# Patient Record
Sex: Female | Born: 1975 | Hispanic: Yes | Smoking: Never smoker
Health system: Southern US, Community
[De-identification: ages and names within clinical notes are randomized; demographics above are authoritative.]

## PROBLEM LIST (undated history)

## (undated) DIAGNOSIS — Z8679 Personal history of other diseases of the circulatory system: Secondary | ICD-10-CM

## (undated) DIAGNOSIS — E781 Pure hyperglyceridemia: Secondary | ICD-10-CM

## (undated) DIAGNOSIS — E119 Type 2 diabetes mellitus without complications: Secondary | ICD-10-CM

## (undated) DIAGNOSIS — Z5189 Encounter for other specified aftercare: Secondary | ICD-10-CM

## (undated) DIAGNOSIS — K859 Acute pancreatitis without necrosis or infection, unspecified: Secondary | ICD-10-CM

## (undated) DIAGNOSIS — R87619 Unspecified abnormal cytological findings in specimens from cervix uteri: Secondary | ICD-10-CM

## (undated) DIAGNOSIS — F419 Anxiety disorder, unspecified: Secondary | ICD-10-CM

## (undated) HISTORY — DX: Anxiety disorder, unspecified: F41.9

## (undated) HISTORY — PX: INCONTINENCE SURGERY: SHX676

## (undated) HISTORY — DX: Encounter for other specified aftercare: Z51.89

## (undated) HISTORY — PX: LEEP: SHX91

## (undated) HISTORY — DX: Type 2 diabetes mellitus without complications: E11.9

## (undated) HISTORY — PX: HERNIA REPAIR: SHX51

## (undated) HISTORY — PX: UPPER GASTROINTESTINAL ENDOSCOPY: SHX188

## (undated) HISTORY — DX: Pure hyperglyceridemia: E78.1

## (undated) HISTORY — PX: CHOLECYSTECTOMY: SHX55

---

## 2014-10-12 HISTORY — PX: OTHER SURGICAL HISTORY: SHX169

## 2016-08-22 ENCOUNTER — Emergency Department (HOSPITAL_BASED_OUTPATIENT_CLINIC_OR_DEPARTMENT_OTHER): Payer: Medicaid Other

## 2016-08-22 ENCOUNTER — Emergency Department (HOSPITAL_BASED_OUTPATIENT_CLINIC_OR_DEPARTMENT_OTHER)
Admission: EM | Admit: 2016-08-22 | Discharge: 2016-08-22 | Disposition: A | Discharge status: Home / Self Care | Payer: Medicaid Other | Attending: Emergency Medicine | Admitting: Emergency Medicine

## 2016-08-22 ENCOUNTER — Encounter (HOSPITAL_BASED_OUTPATIENT_CLINIC_OR_DEPARTMENT_OTHER): Payer: Self-pay | Admitting: *Deleted

## 2016-08-22 DIAGNOSIS — R51 Headache: Secondary | ICD-10-CM | POA: Diagnosis present

## 2016-08-22 DIAGNOSIS — M62830 Muscle spasm of back: Secondary | ICD-10-CM | POA: Insufficient documentation

## 2016-08-22 DIAGNOSIS — M62838 Other muscle spasm: Secondary | ICD-10-CM

## 2016-08-22 LAB — PREGNANCY, URINE: PREG TEST UR: NEGATIVE

## 2016-08-22 MED ORDER — METHOCARBAMOL 500 MG PO TABS
1000.0000 mg | ORAL_TABLET | Freq: Once | ORAL | Status: AC
  Administered 2016-08-22: 1000 mg via ORAL
  Filled 2016-08-22: qty 2

## 2016-08-22 MED ORDER — METHOCARBAMOL 500 MG PO TABS: 500.0000 mg | ORAL_TABLET | Freq: Two times a day (BID) | ORAL | 0 refills | Status: DC

## 2016-08-22 MED ORDER — NAPROXEN 375 MG PO TABS: 375.0000 mg | ORAL_TABLET | Freq: Two times a day (BID) | ORAL | 0 refills | Status: DC

## 2016-08-22 MED ORDER — ACETAMINOPHEN 500 MG PO TABS
1000.0000 mg | ORAL_TABLET | Freq: Once | ORAL | Status: AC
  Administered 2016-08-22: 1000 mg via ORAL
  Filled 2016-08-22: qty 2

## 2016-08-22 MED ORDER — PREDNISONE 50 MG PO TABS
60.0000 mg | ORAL_TABLET | Freq: Once | ORAL | Status: AC
  Administered 2016-08-22: 60 mg via ORAL
  Filled 2016-08-22: qty 1

## 2016-08-22 MED ORDER — IBUPROFEN 800 MG PO TABS
800.0000 mg | ORAL_TABLET | Freq: Once | ORAL | Status: AC
  Administered 2016-08-22: 800 mg via ORAL
  Filled 2016-08-22: qty 1

## 2016-08-22 MED ORDER — METOCLOPRAMIDE HCL 10 MG PO TABS
10.0000 mg | ORAL_TABLET | Freq: Once | ORAL | Status: AC
  Administered 2016-08-22: 10 mg via ORAL
  Filled 2016-08-22: qty 1

## 2016-08-22 MED ORDER — DIPHENHYDRAMINE HCL 25 MG PO CAPS
25.0000 mg | ORAL_CAPSULE | Freq: Once | ORAL | Status: AC
  Administered 2016-08-22: 25 mg via ORAL
  Filled 2016-08-22: qty 1

## 2016-08-22 NOTE — ED Triage Notes (Signed)
Pt c/o h/a x 2 hrs

## 2016-08-22 NOTE — ED Provider Notes (Signed)
[facility]   CSN: [id number] Arrival date & time: 08/22/16  0008     History   Chief Complaint Chief Complaint  Patient presents with  . Headache    HPI Allean FoundLaura Madrid is a 40 y.o. female.  The history is provided by the patient.  Headache   This is a new problem. The current episode started 1 to 2 hours ago. The problem occurs constantly. The problem has not changed since onset.The headache is associated with nothing. The pain is located in the occipital region. The quality of the pain is described as dull. The pain is moderate. The pain does not radiate. Pertinent negatives include no anorexia, no fever, no malaise/fatigue, no chest pressure, no near-syncope, no orthopnea, no palpitations, no syncope, no shortness of breath, no nausea and no vomiting. She has tried nothing for the symptoms. The treatment provided no relief.    History reviewed. No pertinent past medical history.  There are no active problems to display for this patient.   Past Surgical History:  Procedure Laterality Date  . CHOLECYSTECTOMY    . HERNIA REPAIR      OB History    No data available       Home Medications    Prior to Admission medications   Not on File    Family History No family history on file.  Social History Social History  Substance Use Topics  . Smoking status: Never Smoker  . Smokeless tobacco: Not on file  . Alcohol use No     Allergies   Penicillins   Review of Systems Review of Systems  Constitutional: Negative for appetite change, fever and malaise/fatigue.  HENT: Negative for drooling and ear pain.   Eyes: Negative for photophobia and visual disturbance.  Respiratory: Negative for shortness of breath.   Cardiovascular: Negative for palpitations, orthopnea, syncope and near-syncope.  Gastrointestinal: Negative for anorexia, nausea and vomiting.  Musculoskeletal: Negative for back pain and neck stiffness.  Neurological: Positive  for headaches. Negative for dizziness, seizures, syncope, facial asymmetry, speech difficulty, weakness, light-headedness and numbness.  All other systems reviewed and are negative.    Physical Exam Updated Vital Signs BP 132/99   Pulse 70   Temp 98 F (36.7 C)   Resp 18   Ht 5\' 5"  (1.651 m)   Wt 142 lb (64.4 kg)   SpO2 100%   BMI 23.63 kg/m   Physical Exam  Constitutional: She is oriented to person, place, and time. She appears well-developed and well-nourished. No distress.  Sitting in the room comfortably with all the lights on  HENT:  Head: Normocephalic and atraumatic.  Nose: Nose normal.  Mouth/Throat: No oropharyngeal exudate.  Spasm as the insertion of the trapezius  Eyes: Conjunctivae and EOM are normal. Pupils are equal, round, and reactive to light.  Neck: Normal range of motion. Neck supple. No JVD present. No neck rigidity. No tracheal deviation and normal range of motion present. No Brudzinski's sign and no Kernig's sign noted.  Cardiovascular: Normal rate, regular rhythm and intact distal pulses.   Pulmonary/Chest: Effort normal and breath sounds normal. No stridor. She has no wheezes. She has no rales.  Abdominal: Soft. Bowel sounds are normal. She exhibits no mass. There is no tenderness. There is no rebound and no guarding.  Musculoskeletal: Normal range of motion.  Lymphadenopathy:    She has no cervical adenopathy.  Neurological: She is alert and oriented to person, place, and time. She displays normal reflexes. No  cranial nerve deficit or sensory deficit. She exhibits normal muscle tone. Coordination normal.  Skin: Skin is warm. Capillary refill takes less than 2 seconds.  Psychiatric: She has a normal mood and affect.     ED Treatments / Results   Vitals:   08/22/16 0014  BP: 132/99  Pulse: 70  Resp: 18  Temp: 98 F (36.7 C)   Results for orders placed or performed during the hospital encounter of 08/22/16  Pregnancy, urine  Result Value Ref  Range   Preg Test, Ur NEGATIVE NEGATIVE   Ct Head Wo Contrast  Result Date: 08/22/2016 CLINICAL DATA:  40 y/o  F; headache. EXAM: CT HEAD WITHOUT CONTRAST TECHNIQUE: Contiguous axial images were obtained from the base of the skull through the vertex without intravenous contrast. COMPARISON:  None. FINDINGS: Brain: No evidence of acute infarction, hemorrhage, hydrocephalus, extra-axial collection or mass lesion/mass effect. Vascular: No hyperdense vessel or unexpected calcification. Skull: Normal. Negative for fracture or focal lesion. Sinuses/Orbits: No acute finding. Other: None. IMPRESSION: Normal CT of the head. Electronically Signed   By: Mitzi HansenLance  Furusawa-Stratton M.D.   On: 08/22/2016 01:37     Procedures Procedures (including critical care time)  Medications Ordered in ED Medications  acetaminophen (TYLENOL) tablet 1,000 mg (1,000 mg Oral Given 08/22/16 0041)  ibuprofen (ADVIL,MOTRIN) tablet 800 mg (800 mg Oral Given 08/22/16 0041)  methocarbamol (ROBAXIN) tablet 1,000 mg (1,000 mg Oral Given 08/22/16 0041)  metoCLOPramide (REGLAN) tablet 10 mg (10 mg Oral Given 08/22/16 0041)  diphenhydrAMINE (BENADRYL) capsule 25 mg (25 mg Oral Given 08/22/16 0041)     Final Clinical Impressions(s) / ED Diagnoses  Highly doubt acute intracranial pathology.  Exam CT and vitals are benign and reassuring  Symptoms consistent with muscle spasm.  She is safe for discharge.All questions answered to patient's satisfaction. Based on history and exam patient has been appropriately medically screened and emergency conditions excluded. Patient is stable for discharge at this time. Follow up with your PMD for recheck in 2 days and strict return precautions given.  New Prescriptions New Prescriptions   No medications on file     Maigen Mozingo, MD 08/22/16 0157

## 2016-08-22 NOTE — ED Notes (Signed)
C/o ha x 2 hours  Has not taken any meds,  When ask pt has nausea and blurred vision

## 2017-04-09 ENCOUNTER — Emergency Department (HOSPITAL_BASED_OUTPATIENT_CLINIC_OR_DEPARTMENT_OTHER)
Admission: EM | Admit: 2017-04-09 | Discharge: 2017-04-09 | Disposition: A | Discharge status: Home / Self Care | Payer: Medicaid Other | Attending: Emergency Medicine | Admitting: Emergency Medicine

## 2017-04-09 ENCOUNTER — Encounter (HOSPITAL_BASED_OUTPATIENT_CLINIC_OR_DEPARTMENT_OTHER): Payer: Self-pay | Admitting: Emergency Medicine

## 2017-04-09 DIAGNOSIS — R42 Dizziness and giddiness: Secondary | ICD-10-CM | POA: Insufficient documentation

## 2017-04-09 DIAGNOSIS — T50901A Poisoning by unspecified drugs, medicaments and biological substances, accidental (unintentional), initial encounter: Secondary | ICD-10-CM | POA: Insufficient documentation

## 2017-04-09 DIAGNOSIS — Z79899 Other long term (current) drug therapy: Secondary | ICD-10-CM | POA: Diagnosis not present

## 2017-04-09 LAB — CBC WITH DIFFERENTIAL/PLATELET
BASOS ABS: 0 10*3/uL (ref 0.0–0.1)
Basophils Relative: 1 %
Eosinophils Absolute: 0.2 10*3/uL (ref 0.0–0.7)
Eosinophils Relative: 2 %
HEMATOCRIT: 39.1 % (ref 36.0–46.0)
Hemoglobin: 13.5 g/dL (ref 12.0–15.0)
LYMPHS ABS: 1.9 10*3/uL (ref 0.7–4.0)
LYMPHS PCT: 25 %
MCH: 31.1 pg (ref 26.0–34.0)
MCHC: 34.5 g/dL (ref 30.0–36.0)
MCV: 90.1 fL (ref 78.0–100.0)
Monocytes Absolute: 0.6 10*3/uL (ref 0.1–1.0)
Monocytes Relative: 8 %
NEUTROS ABS: 5 10*3/uL (ref 1.7–7.7)
Neutrophils Relative %: 64 %
Platelets: 223 10*3/uL (ref 150–400)
RBC: 4.34 MIL/uL (ref 3.87–5.11)
RDW: 13.5 % (ref 11.5–15.5)
WBC: 7.6 10*3/uL (ref 4.0–10.5)

## 2017-04-09 LAB — COMPREHENSIVE METABOLIC PANEL
ALK PHOS: 90 U/L (ref 38–126)
ALT: 20 U/L (ref 14–54)
AST: 19 U/L (ref 15–41)
Albumin: 4.5 g/dL (ref 3.5–5.0)
Anion gap: 8 (ref 5–15)
BILIRUBIN TOTAL: 0.9 mg/dL (ref 0.3–1.2)
BUN: 18 mg/dL (ref 6–20)
CALCIUM: 9.2 mg/dL (ref 8.9–10.3)
CHLORIDE: 109 mmol/L (ref 101–111)
CO2: 21 mmol/L — ABNORMAL LOW (ref 22–32)
CREATININE: 0.8 mg/dL (ref 0.44–1.00)
Glucose, Bld: 106 mg/dL — ABNORMAL HIGH (ref 65–99)
Potassium: 4.2 mmol/L (ref 3.5–5.1)
Sodium: 138 mmol/L (ref 135–145)
TOTAL PROTEIN: 7.8 g/dL (ref 6.5–8.1)

## 2017-04-09 LAB — SALICYLATE LEVEL

## 2017-04-09 LAB — ACETAMINOPHEN LEVEL: Acetaminophen (Tylenol), Serum: <10 ug/mL — ABNORMAL LOW (ref 10–30)

## 2017-04-09 NOTE — ED Notes (Signed)
ED Provider at bedside. 

## 2017-04-09 NOTE — ED Provider Notes (Signed)
[facility]   CSN: [id number] Arrival date & time: 04/09/17  1412     History   Chief Complaint Chief Complaint  Patient presents with  . Drug Overdose    HPI Robyn Rivera is a 41 y.o. female.  HPI Patient presents to the emergency room for evaluation of an accidental medication overdose. Patient states she was having some abdominal pain last night. She meant to take aspirin. She actually took Topamax. Patient states she took 8 tablets. She denies any intent to harm herself. She denies any depression. She denies homicidal or suicidal ideations. Patient states she was just confused about the medication. When she realized that this morning she became concerned and called her doctor. She was instructed to come to the emergency room. History reviewed. No pertinent past medical history.  There are no active problems to display for this patient.   Past Surgical History:  Procedure Laterality Date  . CHOLECYSTECTOMY    . HERNIA REPAIR      OB History    No data available       Home Medications    Prior to Admission medications   Medication Sig Start Date End Date Taking? Authorizing Provider  methocarbamol (ROBAXIN) 500 MG tablet Take 1 tablet (500 mg total) by mouth 2 (two) times daily. 08/22/16   Palumbo, April, MD  naproxen (NAPROSYN) 375 MG tablet Take 1 tablet (375 mg total) by mouth 2 (two) times daily. 08/22/16   Palumbo, April, MD    Family History History reviewed. No pertinent family history.  Social History Social History  Substance Use Topics  . Smoking status: Never Smoker  . Smokeless tobacco: Never Used  . Alcohol use No     Allergies   Penicillins   Review of Systems Review of Systems  All other systems reviewed and are negative.    Physical Exam Updated Vital Signs BP 132/84 (BP Location: Right Arm)   Pulse 87   Temp 98.5 F (36.9 C) (Oral)   Resp 18   Ht 1.651 m (5\' 5" )   Wt 62.1 kg (137 lb)   LMP  04/08/2017   SpO2 98%   BMI 22.80 kg/m   Physical Exam  Constitutional: She appears well-developed and well-nourished. No distress.  HENT:  Head: Normocephalic and atraumatic.  Right Ear: External ear normal.  Left Ear: External ear normal.  Eyes: Conjunctivae are normal. Right eye exhibits no discharge. Left eye exhibits no discharge. No scleral icterus.  Neck: Neck supple. No tracheal deviation present.  Cardiovascular: Normal rate, regular rhythm and intact distal pulses.   Pulmonary/Chest: Effort normal and breath sounds normal. No stridor. No respiratory distress. She has no wheezes. She has no rales.  Abdominal: Soft. Bowel sounds are normal. She exhibits no distension. There is no tenderness. There is no rebound and no guarding.  Musculoskeletal: She exhibits no edema or tenderness.  Neurological: She is alert. She has normal strength. No cranial nerve deficit (no facial droop, extraocular movements intact, no slurred speech) or sensory deficit. She exhibits normal muscle tone. She displays no seizure activity. Coordination normal.  Skin: Skin is warm and dry. No rash noted.  Psychiatric: She has a normal mood and affect.  Nursing note and vitals reviewed.    ED Treatments / Results  Labs (all labs ordered are listed, but only abnormal results are displayed) Labs Reviewed  COMPREHENSIVE METABOLIC PANEL - Abnormal; Notable for the following:       Result Value  CO2 21 (*)    Glucose, Bld 106 (*)    All other components within normal limits  ACETAMINOPHEN LEVEL - Abnormal; Notable for the following:    Acetaminophen (Tylenol), Serum <10 (*)    All other components within normal limits  CBC WITH DIFFERENTIAL/PLATELET  SALICYLATE LEVEL    Radiology No results found.  Procedures Procedures (including critical care time)  Medications Ordered in ED Medications - No data to display   Initial Impression / Assessment and Plan / ED Course  I have reviewed the triage  vital signs and the nursing notes.  Pertinent labs & imaging results that were available during my care of the patient were reviewed by me and considered in my medical decision making (see chart for details).   poison center was consult did. They state at this point the medication should have been metabolized. They recommended laboratory testing to evaluate for any other type of ingestion in case the patient is not being truthful.  Based on my conversation with her, I have a low suspicion for intentional drug overdose  Labs normal.  Pt states she is ready to go home  Final Clinical Impressions(s) / ED Diagnoses   Final diagnoses:  Accidental drug overdose, initial encounter    New Prescriptions New Prescriptions   No medications on file     Linwood DibblesKnapp, Adryan Shin, MD 04/09/17 1559

## 2017-04-09 NOTE — ED Notes (Signed)
poison control called recommendations received. MD aware of the recommendations.

## 2017-04-09 NOTE — ED Triage Notes (Signed)
Patient states that she started to have her period yesterday and she wanted to take 8 ASA  - however she reached for the topamx bottle and took those instead at 11 pm last night. Reports that she called her Dr/ today and they told her to come here. Patient states that she is dizzy and really tired today. Patient states "They game me those medications 3 months ago because I almost had a stroke, I never take them"

## 2017-04-09 NOTE — Discharge Instructions (Signed)
Follow up with your primary care doctor, return as needed for worsening symptoms °

## 2017-06-27 ENCOUNTER — Encounter (HOSPITAL_BASED_OUTPATIENT_CLINIC_OR_DEPARTMENT_OTHER): Payer: Self-pay | Admitting: Emergency Medicine

## 2017-06-27 ENCOUNTER — Emergency Department (HOSPITAL_BASED_OUTPATIENT_CLINIC_OR_DEPARTMENT_OTHER): Payer: Medicaid Other

## 2017-06-27 ENCOUNTER — Emergency Department (HOSPITAL_BASED_OUTPATIENT_CLINIC_OR_DEPARTMENT_OTHER)
Admission: EM | Admit: 2017-06-27 | Discharge: 2017-06-27 | Disposition: A | Discharge status: Home / Self Care | Payer: Medicaid Other | Attending: Physician Assistant | Admitting: Physician Assistant

## 2017-06-27 DIAGNOSIS — Z79899 Other long term (current) drug therapy: Secondary | ICD-10-CM | POA: Diagnosis not present

## 2017-06-27 DIAGNOSIS — Z7982 Long term (current) use of aspirin: Secondary | ICD-10-CM | POA: Diagnosis not present

## 2017-06-27 DIAGNOSIS — R079 Chest pain, unspecified: Secondary | ICD-10-CM | POA: Diagnosis present

## 2017-06-27 DIAGNOSIS — R0789 Other chest pain: Secondary | ICD-10-CM

## 2017-06-27 HISTORY — DX: Personal history of other diseases of the circulatory system: Z86.79

## 2017-06-27 LAB — URINALYSIS, MICROSCOPIC (REFLEX)

## 2017-06-27 LAB — CBC
HCT: 38 % (ref 36.0–46.0)
Hemoglobin: 13.5 g/dL (ref 12.0–15.0)
MCH: 31.6 pg (ref 26.0–34.0)
MCHC: 35.5 g/dL (ref 30.0–36.0)
MCV: 89 fL (ref 78.0–100.0)
PLATELETS: 166 10*3/uL (ref 150–400)
RBC: 4.27 MIL/uL (ref 3.87–5.11)
RDW: 13.7 % (ref 11.5–15.5)
WBC: 6.4 10*3/uL (ref 4.0–10.5)

## 2017-06-27 LAB — BASIC METABOLIC PANEL
Anion gap: 6 (ref 5–15)
BUN: 14 mg/dL (ref 6–20)
CALCIUM: 8.8 mg/dL — AB (ref 8.9–10.3)
CO2: 21 mmol/L — ABNORMAL LOW (ref 22–32)
Chloride: 109 mmol/L (ref 101–111)
Creatinine, Ser: 0.74 mg/dL (ref 0.44–1.00)
GFR calc Af Amer: >60 mL/min (ref >60)
GLUCOSE: 101 mg/dL — AB (ref 65–99)
Potassium: 3.7 mmol/L (ref 3.5–5.1)
SODIUM: 136 mmol/L (ref 135–145)

## 2017-06-27 LAB — D-DIMER, QUANTITATIVE: D-Dimer, Quant: 0.31 ug/mL-FEU (ref 0.00–0.50)

## 2017-06-27 LAB — URINALYSIS, ROUTINE W REFLEX MICROSCOPIC
BILIRUBIN URINE: NEGATIVE
Glucose, UA: NEGATIVE mg/dL
KETONES UR: NEGATIVE mg/dL
Leukocytes, UA: NEGATIVE
NITRITE: NEGATIVE
PROTEIN: 100 mg/dL — AB
Specific Gravity, Urine: 1.025 (ref 1.005–1.030)
pH: 5.5 (ref 5.0–8.0)

## 2017-06-27 LAB — TROPONIN I

## 2017-06-27 MED ORDER — ASPIRIN 81 MG PO CHEW
243.0000 mg | CHEWABLE_TABLET | Freq: Once | ORAL | Status: AC
  Administered 2017-06-27: 243 mg via ORAL
  Filled 2017-06-27: qty 3

## 2017-06-27 NOTE — Discharge Instructions (Signed)
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
## 2017-06-27 NOTE — ED Notes (Signed)
Patient transported to X-ray 
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
## 2017-06-27 NOTE — ED Notes (Signed)
Family at bedside. 

## 2017-07-13 ENCOUNTER — Emergency Department (HOSPITAL_BASED_OUTPATIENT_CLINIC_OR_DEPARTMENT_OTHER): Payer: Medicaid Other

## 2017-07-13 ENCOUNTER — Emergency Department (HOSPITAL_BASED_OUTPATIENT_CLINIC_OR_DEPARTMENT_OTHER)
Admission: EM | Admit: 2017-07-13 | Discharge: 2017-07-13 | Disposition: A | Discharge status: Home / Self Care | Payer: Medicaid Other | Attending: Emergency Medicine | Admitting: Emergency Medicine

## 2017-07-13 ENCOUNTER — Encounter (HOSPITAL_BASED_OUTPATIENT_CLINIC_OR_DEPARTMENT_OTHER): Payer: Self-pay

## 2017-07-13 DIAGNOSIS — N83201 Unspecified ovarian cyst, right side: Secondary | ICD-10-CM | POA: Diagnosis not present

## 2017-07-13 DIAGNOSIS — Z7982 Long term (current) use of aspirin: Secondary | ICD-10-CM | POA: Insufficient documentation

## 2017-07-13 DIAGNOSIS — R1030 Lower abdominal pain, unspecified: Secondary | ICD-10-CM | POA: Diagnosis present

## 2017-07-13 HISTORY — DX: Unspecified abnormal cytological findings in specimens from cervix uteri: R87.619

## 2017-07-13 HISTORY — DX: Acute pancreatitis without necrosis or infection, unspecified: K85.90

## 2017-07-13 LAB — CBC WITH DIFFERENTIAL/PLATELET
Basophils Absolute: 0 10*3/uL (ref 0.0–0.1)
Basophils Relative: 0 %
Eosinophils Absolute: 0.1 10*3/uL (ref 0.0–0.7)
Eosinophils Relative: 1 %
HCT: 35.1 % — ABNORMAL LOW (ref 36.0–46.0)
Hemoglobin: 12 g/dL (ref 12.0–15.0)
Lymphocytes Relative: 18 %
Lymphs Abs: 1.4 10*3/uL (ref 0.7–4.0)
MCH: 30.7 pg (ref 26.0–34.0)
MCHC: 34.2 g/dL (ref 30.0–36.0)
MCV: 89.8 fL (ref 78.0–100.0)
Monocytes Absolute: 0.7 10*3/uL (ref 0.1–1.0)
Monocytes Relative: 9 %
Neutro Abs: 5.6 10*3/uL (ref 1.7–7.7)
Neutrophils Relative %: 72 %
Platelets: 159 10*3/uL (ref 150–400)
RBC: 3.91 MIL/uL (ref 3.87–5.11)
RDW: 13.9 % (ref 11.5–15.5)
WBC: 7.9 10*3/uL (ref 4.0–10.5)

## 2017-07-13 LAB — URINALYSIS, ROUTINE W REFLEX MICROSCOPIC
Bilirubin Urine: NEGATIVE
GLUCOSE, UA: NEGATIVE mg/dL
KETONES UR: NEGATIVE mg/dL
Nitrite: NEGATIVE
PH: 6.5 (ref 5.0–8.0)
Protein, ur: NEGATIVE mg/dL
Specific Gravity, Urine: 1.015 (ref 1.005–1.030)

## 2017-07-13 LAB — COMPREHENSIVE METABOLIC PANEL
ALT: 21 U/L (ref 14–54)
ANION GAP: 6 (ref 5–15)
AST: 21 U/L (ref 15–41)
Albumin: 4 g/dL (ref 3.5–5.0)
Alkaline Phosphatase: 75 U/L (ref 38–126)
BUN: 13 mg/dL (ref 6–20)
CHLORIDE: 104 mmol/L (ref 101–111)
CO2: 25 mmol/L (ref 22–32)
CREATININE: 0.72 mg/dL (ref 0.44–1.00)
Calcium: 9.1 mg/dL (ref 8.9–10.3)
Glucose, Bld: 112 mg/dL — ABNORMAL HIGH (ref 65–99)
Potassium: 3.7 mmol/L (ref 3.5–5.1)
Sodium: 135 mmol/L (ref 135–145)
Total Bilirubin: 1 mg/dL (ref 0.3–1.2)
Total Protein: 6.8 g/dL (ref 6.5–8.1)

## 2017-07-13 LAB — LIPASE, BLOOD: Lipase: 28 U/L (ref 11–51)

## 2017-07-13 LAB — URINALYSIS, MICROSCOPIC (REFLEX)

## 2017-07-13 LAB — PREGNANCY, URINE: Preg Test, Ur: NEGATIVE

## 2017-07-13 MED ORDER — FENTANYL CITRATE (PF) 100 MCG/2ML IJ SOLN
100.0000 ug | Freq: Once | INTRAMUSCULAR | Status: AC
  Administered 2017-07-13: 100 ug via INTRAVENOUS
  Filled 2017-07-13: qty 2

## 2017-07-13 MED ORDER — IOPAMIDOL (ISOVUE-300) INJECTION 61%
100.0000 mL | Freq: Once | INTRAVENOUS | Status: AC | PRN
  Administered 2017-07-13: 100 mL via INTRAVENOUS

## 2017-07-13 MED ORDER — ONDANSETRON HCL 4 MG/2ML IJ SOLN
4.0000 mg | Freq: Once | INTRAMUSCULAR | Status: AC
  Administered 2017-07-13: 4 mg via INTRAVENOUS
  Filled 2017-07-13: qty 2

## 2017-07-13 MED ORDER — SODIUM CHLORIDE 0.9 % IV SOLN
INTRAVENOUS | Status: DC
  Administered 2017-07-13: 05:00:00 via INTRAVENOUS

## 2017-07-13 MED ORDER — HYDROCODONE-ACETAMINOPHEN 5-325 MG PO TABS: 1.0000 | ORAL_TABLET | ORAL | 0 refills | Status: DC | PRN

## 2017-07-13 MED ORDER — IBUPROFEN 400 MG PO TABS: 400.0000 mg | ORAL_TABLET | Freq: Four times a day (QID) | ORAL | 0 refills | Status: DC | PRN

## 2017-07-13 NOTE — ED Provider Notes (Signed)
  Physical Exam  BP 112/66 (BP Location: Left Arm)   Pulse (!) 56   Temp 98.8 F (37.1 C) (Oral)   Resp 18   Ht  (1.651 m)   Wt 61.2 kg (135 lb)   LMP 06/28/2017   SpO2 100%   BMI 22.47 kg/m   Physical Exam  ED Course  Procedures  MDM  Patient with right lower quadrant abdominal pain. CT scan shows adnexal mass. Ultrasound done and shows likely hemorrhagic ovarian cyst. Discussed with patient. Patient refused pelvic exam after discussion. Will have follow-up with her gynecologist.      Benjiman Core, MD 07/13/17 1135

## 2017-07-13 NOTE — Discharge Instructions (Signed)
Follow with your gynecologist. °

## 2017-07-13 NOTE — ED Provider Notes (Signed)
[facility] Provider Note: Lowella Dell, MD, FACEP  CSN: [id number] MRN: [id number] ARRIVAL: 07/13/17 at 0435 ROOM: [facility]OTF/OTF   CHIEF COMPLAINT  Abdominal Pain   HISTORY OF PRESENT ILLNESS  07/13/17 4:53 AM Robyn Rivera is a 41 y.o. female three-day history of abdominal pain. The abdominal pain is located primarily in the right lower quadrant, the right flank and the epigastrium. The pain is dull and she rates it as a 7 out of 10. Pain is worse with ambulation, movement or palpation of the abdomen. The epigastric pain is worse after eating or drinking but has improved after taking Prilosec for the past 2 days. She denies associated fever, chills, nausea, vomiting, diarrhea, dysuria, vaginal bleeding or vaginal discharge.   Past Medical History:  Diagnosis Date  . Abnormal Pap smear of cervix    Patient had a procedure to cervix- ?LEEP?  Marland Kitchen History of angina   . Pancreatitis     Past Surgical History:  Procedure Laterality Date  . CHOLECYSTECTOMY    . HERNIA REPAIR     emergency repair surgery following ovarian cystectomy  . INCONTINENCE SURGERY     Stress Incontinence surgery per patient  . ovarian cystectomy Left 2016    No family history on file.  Social History  Substance Use Topics  . Smoking status: Never Smoker  . Smokeless tobacco: Never Used  . Alcohol use No    Prior to Admission medications   Medication Sig Start Date End Date Taking? Authorizing Provider  aspirin 325 MG EC tablet Take 325 mg by mouth daily.    [provider]  HYDROcodone-acetaminophen (NORCO/VICODIN) 5-325 MG tablet Take 1-2 tablets by mouth every 4 (four) hours as needed. 07/13/17   Benjiman Core, MD  ibuprofen (ADVIL,MOTRIN) 400 MG tablet Take 1 tablet (400 mg total) by mouth every 6 (six) hours as needed. 07/13/17   Benjiman Core, MD  methocarbamol (ROBAXIN) 500 MG tablet Take 1 tablet (500 mg total) by mouth 2 (two) times daily. 08/22/16   Palumbo, April,  MD  naproxen (NAPROSYN) 375 MG tablet Take 1 tablet (375 mg total) by mouth 2 (two) times daily. 08/22/16   Palumbo, April, MD    Allergies Penicillins   REVIEW OF SYSTEMS  Negative except as noted here or in the History of Present Illness.   PHYSICAL EXAMINATION  Initial Vital Signs Blood pressure 121/65, pulse 72, temperature 98.8 F (37.1 C), temperature source Oral, resp. rate 18, height  (1.651 m), weight 61.2 kg (135 lb), last menstrual period 06/28/2017, SpO2 100 %.  Examination General: Well-developed, well-nourished female in no acute distress; appearance consistent with age of record HENT: normocephalic; atraumatic Eyes: pupils equal, round and reactive to light; extraocular muscles intact Neck: supple Heart: regular rate and rhythm Lungs: clear to auscultation bilaterally Abdomen: soft; nondistended; epigastric and lower abdominal pain most prominent in the right lower quadrant; no masses or hepatosplenomegaly; bowel sounds present GU: Bilateral CVA tenderness, right greater than left Extremities: No deformity; full range of motion; pulses normal Neurologic: Awake, alert and oriented; motor function intact in all extremities and symmetric; no facial droop Skin: Warm and dry Psychiatric: Normal mood and affect   RESULTS  Summary of this visit's results, reviewed by myself:   EKG Interpretation  Date/Time:    Ventricular Rate:    PR Interval:    QRS Duration:   QT Interval:    QTC Calculation:   R Axis:     Text Interpretation:  Laboratory Studies: Results for orders placed or performed during the hospital encounter of 07/13/17 (from the past 24 hour(s))  Urinalysis, Routine w reflex microscopic     Status: Abnormal   Collection Time: 07/13/17  4:45 AM  Result Value Ref Range   Color, Urine YELLOW YELLOW   APPearance CLOUDY (A) CLEAR   Specific Gravity, Urine 1.015 1.005 - 1.030   pH 6.5 5.0 - 8.0   Glucose, UA NEGATIVE NEGATIVE mg/dL    Hgb urine dipstick TRACE (A) NEGATIVE   Bilirubin Urine NEGATIVE NEGATIVE   Ketones, ur NEGATIVE NEGATIVE mg/dL   Protein, ur NEGATIVE NEGATIVE mg/dL   Nitrite NEGATIVE NEGATIVE   Leukocytes, UA TRACE (A) NEGATIVE  Pregnancy, urine     Status: None   Collection Time: 07/13/17  4:45 AM  Result Value Ref Range   Preg Test, Ur NEGATIVE NEGATIVE  Urinalysis, Microscopic (reflex)     Status: Abnormal   Collection Time: 07/13/17  4:45 AM  Result Value Ref Range   RBC / HPF 0-5 0 - 5 RBC/hpf   WBC, UA 0-5 0 - 5 WBC/hpf   Bacteria, UA MANY (A) NONE SEEN   Squamous Epithelial / LPF 6-30 (A) NONE SEEN  CBC with Differential/Platelet     Status: Abnormal   Collection Time: 07/13/17  5:10 AM  Result Value Ref Range   WBC 7.9 4.0 - 10.5 K/uL   RBC 3.91 3.87 - 5.11 MIL/uL   Hemoglobin 12.0 12.0 - 15.0 g/dL   HCT 16.1 (L) 09.6 - 04.5 %   MCV 89.8 78.0 - 100.0 fL   MCH 30.7 26.0 - 34.0 pg   MCHC 34.2 30.0 - 36.0 g/dL   RDW 40.9 81.1 - 91.4 %   Platelets 159 150 - 400 K/uL   Neutrophils Relative % 72 %   Neutro Abs 5.6 1.7 - 7.7 K/uL   Lymphocytes Relative 18 %   Lymphs Abs 1.4 0.7 - 4.0 K/uL   Monocytes Relative 9 %   Monocytes Absolute 0.7 0.1 - 1.0 K/uL   Eosinophils Relative 1 %   Eosinophils Absolute 0.1 0.0 - 0.7 K/uL   Basophils Relative 0 %   Basophils Absolute 0.0 0.0 - 0.1 K/uL  Comprehensive metabolic panel     Status: Abnormal   Collection Time: 07/13/17  5:10 AM  Result Value Ref Range   Sodium 135 135 - 145 mmol/L   Potassium 3.7 3.5 - 5.1 mmol/L   Chloride 104 101 - 111 mmol/L   CO2 25 22 - 32 mmol/L   Glucose, Bld 112 (H) 65 - 99 mg/dL   BUN 13 6 - 20 mg/dL   Creatinine, Ser 7.82 0.44 - 1.00 mg/dL   Calcium 9.1 8.9 - 95.6 mg/dL   Total Protein 6.8 6.5 - 8.1 g/dL   Albumin 4.0 3.5 - 5.0 g/dL   AST 21 15 - 41 U/L   ALT 21 14 - 54 U/L   Alkaline Phosphatase 75 38 - 126 U/L   Total Bilirubin 1.0 0.3 - 1.2 mg/dL   GFR calc non Af Amer >60 >60 mL/min   GFR calc Af  Amer >60 >60 mL/min   Anion gap 6 5 - 15  Lipase, blood     Status: None   Collection Time: 07/13/17  5:10 AM  Result Value Ref Range   Lipase 28 11 - 51 U/L   Imaging Studies: US Transvaginal Non-ob  Result Date: 07/13/2017 CLINICAL DATA:  41 year old female with 3  days of right lower quadrant pain, nausea and dysuria. LMP 06/28/2017. EXAM: TRANSABDOMINAL AND TRANSVAGINAL ULTRASOUND OF PELVIS DOPPLER ULTRASOUND OF OVARIES TECHNIQUE: Both transabdominal and transvaginal ultrasound examinations of the pelvis were performed. Transabdominal technique was performed for global imaging of the pelvis including uterus, ovaries, adnexal regions, and pelvic cul-de-sac. It was necessary to proceed with endovaginal exam following the transabdominal exam to visualize the endometrium and adnexa. Color and duplex Doppler ultrasound was utilized to evaluate blood flow to the ovaries. COMPARISON:  07/13/2017 CT abdomen/ pelvis. FINDINGS: Uterus Measurements: 10.2 x 5.6 x 6.5 cm. Mildly enlarged anteverted uterus. Diffusely mildly heterogeneous myometrium with scattered refractory myometrial shadowing, suggesting diffuse adenomyosis of the uterus. No convincing uterine fibroids. Endometrium Thickness: 11 mm. No endometrial cavity fluid or focal endometrial mass. Right ovary Measurements: 6.8 x 2.7 x 4.8 cm. There is a mixed echogenicity predominantly hyperechoic 4.0 x 1.5 x 4.1 cm right ovarian mass with no convincing internal vascularity, most compatible with a hemorrhagic right ovarian cyst. Otherwise no right adnexal masses. Left ovary Measurements: 2.8 x 2.1 x 1.8 cm. Normal appearance/no adnexal mass. Pulsed Doppler evaluation of both ovaries demonstrates normal low-resistance arterial and venous waveforms. Other findings No abnormal free fluid. IMPRESSION: 1. No evidence of adnexal torsion. 2. Mixed echogenicity 4.1 cm right ovarian mass without convincing internal vascularity, most compatible with a hemorrhagic right  ovarian cyst. Recommend attention on follow-up transvaginal pelvic ultrasound in 2-3 menstrual cycles. 3. Mildly enlarged anteverted uterus with sonographic findings suggestive of diffuse uterine adenomyosis. No convincing uterine fibroids. Electronically Signed   By: Delbert Phenix M.D.   On: 07/13/2017 10:51   US Pelvis Complete  Result Date: 07/13/2017 CLINICAL DATA:  41 year old female with 3 days of right lower quadrant pain, nausea and dysuria. LMP 06/28/2017. EXAM: TRANSABDOMINAL AND TRANSVAGINAL ULTRASOUND OF PELVIS DOPPLER ULTRASOUND OF OVARIES TECHNIQUE: Both transabdominal and transvaginal ultrasound examinations of the pelvis were performed. Transabdominal technique was performed for global imaging of the pelvis including uterus, ovaries, adnexal regions, and pelvic cul-de-sac. It was necessary to proceed with endovaginal exam following the transabdominal exam to visualize the endometrium and adnexa. Color and duplex Doppler ultrasound was utilized to evaluate blood flow to the ovaries. COMPARISON:  07/13/2017 CT abdomen/ pelvis. FINDINGS: Uterus Measurements: 10.2 x 5.6 x 6.5 cm. Mildly enlarged anteverted uterus. Diffusely mildly heterogeneous myometrium with scattered refractory myometrial shadowing, suggesting diffuse adenomyosis of the uterus. No convincing uterine fibroids. Endometrium Thickness: 11 mm. No endometrial cavity fluid or focal endometrial mass. Right ovary Measurements: 6.8 x 2.7 x 4.8 cm. There is a mixed echogenicity predominantly hyperechoic 4.0 x 1.5 x 4.1 cm right ovarian mass with no convincing internal vascularity, most compatible with a hemorrhagic right ovarian cyst. Otherwise no right adnexal masses. Left ovary Measurements: 2.8 x 2.1 x 1.8 cm. Normal appearance/no adnexal mass. Pulsed Doppler evaluation of both ovaries demonstrates normal low-resistance arterial and venous waveforms. Other findings No abnormal free fluid. IMPRESSION: 1. No evidence of adnexal torsion. 2.  Mixed echogenicity 4.1 cm right ovarian mass without convincing internal vascularity, most compatible with a hemorrhagic right ovarian cyst. Recommend attention on follow-up transvaginal pelvic ultrasound in 2-3 menstrual cycles. 3. Mildly enlarged anteverted uterus with sonographic findings suggestive of diffuse uterine adenomyosis. No convincing uterine fibroids. Electronically Signed   By: Delbert Phenix M.D.   On: 07/13/2017 10:51   Ct Abdomen Pelvis W Contrast  Result Date: 07/13/2017 CLINICAL DATA:  41 y/o female with right lower quadrant abdominal pain for  3 days. EXAM: CT ABDOMEN AND PELVIS WITH CONTRAST TECHNIQUE: Multidetector CT imaging of the abdomen and pelvis was performed using the standard protocol following bolus administration of intravenous contrast. CONTRAST:  ISOVUE-300 IOPAMIDOL (ISOVUE-300) INJECTION 61% COMPARISON:  Chest radiographs 06/27/2017. FINDINGS: Lower chest: Mild bilateral lower lobe dependent pulmonary atelectasis. Otherwise negative lung bases. No pericardial or pleural effusion. Hepatobiliary: There is trace free fluid along the right inferior liver tip on series 2, image 43 which is nonspecific. No other abdominal free fluid identified. Subtotal surgical absence of the gallbladder (series 2, image 27). There is also a surgical clip along the mesentery of the hepatic flexure on series 2 image 45. Liver enhancement is within normal limits. No biliary ductal enlargement. Pancreas: Negative. Spleen: Negative. Adrenals/Urinary Tract: Normal adrenal glands. Normal renal enhancement. No perinephric stranding. No hydronephrosis or hydroureter; mild right extrarenal pelvis. No calcifications along the course of either ureter, the right is less well demonstrated. Unremarkable urinary bladder. Stomach/Bowel: Gas distended but otherwise negative rectum. Mildly redundant sigmoid colon. Retained stool from the descending to the right colon. Flocculated material in the terminal ileum  which is also mildly dilated. The appendix measures slightly larger than normal (7-8 mm - series 2, image 69) but contains gas and is not inflamed. Oral contrast has not yet reached the distal small bowel. The more proximal small bowel loops appear normal. Negative stomach and duodenum. Vascular/Lymphatic: Major arterial structures in the abdomen and pelvis are normal. Prominent left gonadal vein contrast incidentally noted. Portal venous system appears patent. Reproductive: Heterogeneously enlarged right adnexa seen on sagittal image 56. This includes multiple low-density areas, the largest measuring 3.2 cm with intermediate density as seen on series 2, image 63. No surrounding inflammatory stranding. The left ovary seems to be diminutive and normal. Mildly heterogeneous uterine enhancement is felt to be physiologic. Other: No pelvic free fluid. Musculoskeletal: Negative. IMPRESSION: 1. Heterogeneous enlargement of the right adnexa - best seen on sagittal image 56 - which might be the symptomatic abnormality. Recommend follow-up Transabdominal and Transvaginal Pelvis Ultrasound. 2. Trace free fluid along the right inferior liver tip, nonspecific. 3. Prominent but noninflamed appendix.  Retained stool in the colon. 4. Mild lung base atelectasis. Electronically Signed   By: Odessa Fleming M.D.   On: 07/13/2017 08:47   [place] Art/ven Flow Abd Pelv Doppler  Result Date: 07/13/2017 CLINICAL DATA:  41 year old female with 3 days of right lower quadrant pain, nausea and dysuria. LMP 06/28/2017. EXAM: TRANSABDOMINAL AND TRANSVAGINAL ULTRASOUND OF PELVIS DOPPLER ULTRASOUND OF OVARIES TECHNIQUE: Both transabdominal and transvaginal ultrasound examinations of the pelvis were performed. Transabdominal technique was performed for global imaging of the pelvis including uterus, ovaries, adnexal regions, and pelvic cul-de-sac. It was necessary to proceed with endovaginal exam following the transabdominal exam to visualize the endometrium  and adnexa. Color and duplex Doppler ultrasound was utilized to evaluate blood flow to the ovaries. COMPARISON:  07/13/2017 CT abdomen/ pelvis. FINDINGS: Uterus Measurements: 10.2 x 5.6 x 6.5 cm. Mildly enlarged anteverted uterus. Diffusely mildly heterogeneous myometrium with scattered refractory myometrial shadowing, suggesting diffuse adenomyosis of the uterus. No convincing uterine fibroids. Endometrium Thickness: 11 mm. No endometrial cavity fluid or focal endometrial mass. Right ovary Measurements: 6.8 x 2.7 x 4.8 cm. There is a mixed echogenicity predominantly hyperechoic 4.0 x 1.5 x 4.1 cm right ovarian mass with no convincing internal vascularity, most compatible with a hemorrhagic right ovarian cyst. Otherwise no right adnexal masses. Left ovary Measurements: 2.8 x 2.1 x 1.8  cm. Normal appearance/no adnexal mass. Pulsed Doppler evaluation of both ovaries demonstrates normal low-resistance arterial and venous waveforms. Other findings No abnormal free fluid. IMPRESSION: 1. No evidence of adnexal torsion. 2. Mixed echogenicity 4.1 cm right ovarian mass without convincing internal vascularity, most compatible with a hemorrhagic right ovarian cyst. Recommend attention on follow-up transvaginal pelvic ultrasound in 2-3 menstrual cycles. 3. Mildly enlarged anteverted uterus with sonographic findings suggestive of diffuse uterine adenomyosis. No convincing uterine fibroids. Electronically Signed   By: Delbert Phenix M.D.   On: 07/13/2017 10:51    ED COURSE  Nursing notes and initial vitals signs, including pulse oximetry, reviewed.  Vitals:   07/13/17 0704 07/13/17 0730 07/13/17 0800 07/13/17 1111  BP: 122/70 117/84 115/70 112/66  Pulse: (!) 50 62 67 (!) 56  Resp: 16 16 16 18   Temp:      TempSrc:      SpO2: 100% 98% 98% 100%  Weight:      Height:        PROCEDURES    ED DIAGNOSES     ICD-10-CM   1. Cyst of right ovary N83.201        Shayla Heming, Jonny Ruiz, MD 07/13/17 2241

## 2017-07-13 NOTE — ED Triage Notes (Signed)
Pt c/o generalized lower abdominal pain with bilateral lower back pain, worsening since Saturday with nausea.  The pain is worse with ambulation, worse with bowel movement and worse after eating or drinking.  Also c/o dysuria

## 2017-07-13 NOTE — ED Notes (Signed)
Patient transported to Ultrasound 

## 2017-07-14 LAB — HIV ANTIBODY (ROUTINE TESTING W REFLEX): HIV SCREEN 4TH GENERATION: NONREACTIVE

## 2017-07-14 LAB — RPR: RPR: NONREACTIVE

## 2018-05-28 ENCOUNTER — Encounter (HOSPITAL_BASED_OUTPATIENT_CLINIC_OR_DEPARTMENT_OTHER): Payer: Self-pay | Admitting: Emergency Medicine

## 2018-05-28 ENCOUNTER — Emergency Department (HOSPITAL_BASED_OUTPATIENT_CLINIC_OR_DEPARTMENT_OTHER)
Admission: EM | Admit: 2018-05-28 | Discharge: 2018-05-28 | Disposition: A | Discharge status: Home / Self Care | Payer: Medicaid Other | Attending: Emergency Medicine | Admitting: Emergency Medicine

## 2018-05-28 ENCOUNTER — Other Ambulatory Visit: Payer: Self-pay

## 2018-05-28 DIAGNOSIS — Z7982 Long term (current) use of aspirin: Secondary | ICD-10-CM | POA: Insufficient documentation

## 2018-05-28 DIAGNOSIS — L259 Unspecified contact dermatitis, unspecified cause: Secondary | ICD-10-CM | POA: Insufficient documentation

## 2018-05-28 DIAGNOSIS — Z79899 Other long term (current) drug therapy: Secondary | ICD-10-CM | POA: Insufficient documentation

## 2018-05-28 MED ORDER — METHYLPREDNISOLONE SODIUM SUCC 125 MG IJ SOLR
125.0000 mg | Freq: Once | INTRAMUSCULAR | Status: AC
  Administered 2018-05-28: 125 mg via INTRAMUSCULAR
  Filled 2018-05-28: qty 2

## 2018-05-28 MED ORDER — PREDNISONE 20 MG PO TABS: ORAL_TABLET | ORAL | 0 refills | Status: DC

## 2018-05-28 NOTE — ED Provider Notes (Signed)
[facility] EMERGENCY DEPARTMENT Provider Note   CSN: [id number] Arrival date & time: 05/28/18  1010     History   Chief Complaint Chief Complaint  Patient presents with  . Rash    HPI Robyn Rivera is a 42 y.o. female.  HPI Patient presents with itching rash to upper extremities, abdomen and thighs.  States the rash started on her right upper arm.  Fianc had a similar rash after being exposed to poison oak.  No fever or chills.  No known insect bites.  Has been taking Benadryl with minimal relief. Past Medical History:  Diagnosis Date  . Abnormal Pap smear of cervix    Patient had a procedure to cervix- ?LEEP?  Marland Kitchen. History of angina   . Pancreatitis     There are no active problems to display for this patient.   Past Surgical History:  Procedure Laterality Date  . CHOLECYSTECTOMY    . HERNIA REPAIR     emergency repair surgery following ovarian cystectomy  . INCONTINENCE SURGERY     Stress Incontinence surgery per patient  . ovarian cystectomy Left 2016     OB History    Gravida  5   Para  5   Term  5   Preterm  0   AB  0   Living  5     SAB  0   TAB  0   Ectopic  0   Multiple  0   Live Births  5            Home Medications    Prior to Admission medications   Medication Sig Start Date End Date Taking? Authorizing Provider  aspirin 325 MG EC tablet Take 325 mg by mouth daily.    [provider]  HYDROcodone-acetaminophen (NORCO/VICODIN) 5-325 MG tablet Take 1-2 tablets by mouth every 4 (four) hours as needed. 07/13/17   Benjiman CorePickering, Nathan, MD  ibuprofen (ADVIL,MOTRIN) 400 MG tablet Take 1 tablet (400 mg total) by mouth every 6 (six) hours as needed. 07/13/17   Benjiman CorePickering, Nathan, MD  methocarbamol (ROBAXIN) 500 MG tablet Take 1 tablet (500 mg total) by mouth 2 (two) times daily. 08/22/16   Palumbo, April, MD  naproxen (NAPROSYN) 375 MG tablet Take 1 tablet (375 mg total) by mouth 2 (two) times daily. 08/22/16   Palumbo,  April, MD  predniSONE (DELTASONE) 20 MG tablet 3 tabs po day one, then 2 po daily x 4 days 05/29/18   Loren RacerYelverton, Lorenz Donley, MD    Family History History reviewed. No pertinent family history.  Social History Social History   Tobacco Use  . Smoking status: Never Smoker  . Smokeless tobacco: Never Used  Substance Use Topics  . Alcohol use: No  . Drug use: No     Allergies   Penicillins   Review of Systems Review of Systems  Constitutional: Negative for chills and fever.  HENT: Negative for facial swelling.   Respiratory: Negative for shortness of breath and wheezing.   Cardiovascular: Negative for chest pain.  Gastrointestinal: Negative for abdominal pain, nausea and vomiting.  Musculoskeletal: Negative for back pain and myalgias.  Skin: Positive for rash.  Neurological: Negative for dizziness, weakness, numbness and headaches.  All other systems reviewed and are negative.    Physical Exam Updated Vital Signs BP 123/81 (BP Location: Right Arm)   Pulse 78   Temp 98.3 F (36.8 C) (Oral)   Resp 14   Ht 5\' 4"  (1.626 m)   Wt  69.9 kg   LMP 04/11/2018   SpO2 100%   BMI 26.43 kg/m   Physical Exam  Constitutional: She is oriented to person, place, and time. She appears well-developed and well-nourished. No distress.  HENT:  Head: Normocephalic and atraumatic.  Mouth/Throat: Oropharynx is clear and moist.  Eyes: Pupils are equal, round, and reactive to light. EOM are normal.  Neck: Normal range of motion. Neck supple.  Cardiovascular: Normal rate.  Pulmonary/Chest: Effort normal.  Abdominal: Soft.  Musculoskeletal: Normal range of motion. She exhibits no edema or tenderness.  Lymphadenopathy:    She has no cervical adenopathy.  Neurological: She is alert and oriented to person, place, and time.  Skin: Skin is warm and dry. Rash noted. She is not diaphoretic. No erythema.  Patient with erythematous raised plaques and papules to right upper extremity, abdomen, left  thoracic back and bilateral thighs.  Sharp demarcation of the edges of the rash especially on the right upper extremity.  Consistent with contact dermatitis.  No interdigital rash.  Psychiatric: She has a normal mood and affect. Her behavior is normal.  Nursing note and vitals reviewed.    ED Treatments / Results  Labs (all labs ordered are listed, but only abnormal results are displayed) Labs Reviewed - No data to display  EKG None  Radiology No results found.  Procedures Procedures (including critical care time)  Medications Ordered in ED Medications  methylPREDNISolone sodium succinate (SOLU-MEDROL) 125 mg/2 mL injection 125 mg (125 mg Intramuscular Given 05/28/18 1102)     Initial Impression / Assessment and Plan / ED Course  I have reviewed the triage vital signs and the nursing notes.  Pertinent labs & imaging results that were available during my care of the patient were reviewed by me and considered in my medical decision making (see chart for details).     She would likely contact dermatitis.  Will treat with short course of steroids.  Advised to continue using Benadryl as needed for itching.  Return precautions given.  Final Clinical Impressions(s) / ED Diagnoses   Final diagnoses:  Contact dermatitis, unspecified contact dermatitis type, unspecified trigger    ED Discharge Orders         Ordered    predniSONE (DELTASONE) 20 MG tablet     05/28/18 1124           Loren RacerYelverton, Twisha Vanpelt, MD 05/28/18 1124

## 2018-05-28 NOTE — ED Triage Notes (Signed)
Patient states that she has had a rash all over her body x 2 -3 days

## 2019-02-07 ENCOUNTER — Emergency Department (HOSPITAL_BASED_OUTPATIENT_CLINIC_OR_DEPARTMENT_OTHER)
Admission: EM | Admit: 2019-02-07 | Discharge: 2019-02-07 | Disposition: A | Discharge status: Home / Self Care | Payer: Self-pay | Attending: Emergency Medicine | Admitting: Emergency Medicine

## 2019-02-07 ENCOUNTER — Encounter (HOSPITAL_BASED_OUTPATIENT_CLINIC_OR_DEPARTMENT_OTHER): Payer: Self-pay | Admitting: Adult Health

## 2019-02-07 ENCOUNTER — Emergency Department (HOSPITAL_BASED_OUTPATIENT_CLINIC_OR_DEPARTMENT_OTHER): Payer: Self-pay

## 2019-02-07 ENCOUNTER — Other Ambulatory Visit: Payer: Self-pay

## 2019-02-07 DIAGNOSIS — Y939 Activity, unspecified: Secondary | ICD-10-CM | POA: Insufficient documentation

## 2019-02-07 DIAGNOSIS — S139XXA Sprain of joints and ligaments of unspecified parts of neck, initial encounter: Secondary | ICD-10-CM | POA: Insufficient documentation

## 2019-02-07 DIAGNOSIS — S39012A Strain of muscle, fascia and tendon of lower back, initial encounter: Secondary | ICD-10-CM | POA: Insufficient documentation

## 2019-02-07 DIAGNOSIS — Y999 Unspecified external cause status: Secondary | ICD-10-CM | POA: Diagnosis not present

## 2019-02-07 DIAGNOSIS — Y92410 Unspecified street and highway as the place of occurrence of the external cause: Secondary | ICD-10-CM | POA: Diagnosis not present

## 2019-02-07 DIAGNOSIS — S161XXA Strain of muscle, fascia and tendon at neck level, initial encounter: Secondary | ICD-10-CM | POA: Insufficient documentation

## 2019-02-07 DIAGNOSIS — Z7982 Long term (current) use of aspirin: Secondary | ICD-10-CM | POA: Insufficient documentation

## 2019-02-07 DIAGNOSIS — S199XXA Unspecified injury of neck, initial encounter: Secondary | ICD-10-CM | POA: Diagnosis present

## 2019-02-07 DIAGNOSIS — R51 Headache: Secondary | ICD-10-CM | POA: Insufficient documentation

## 2019-02-07 LAB — URINALYSIS, ROUTINE W REFLEX MICROSCOPIC
Bilirubin Urine: NEGATIVE
Glucose, UA: NEGATIVE mg/dL
Ketones, ur: NEGATIVE mg/dL
Leukocytes,Ua: NEGATIVE
Nitrite: NEGATIVE
Protein, ur: NEGATIVE mg/dL
Specific Gravity, Urine: 1.015 (ref 1.005–1.030)
pH: 6 (ref 5.0–8.0)

## 2019-02-07 LAB — URINALYSIS, MICROSCOPIC (REFLEX)

## 2019-02-07 LAB — PREGNANCY, URINE: Preg Test, Ur: NEGATIVE
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
## 2020-12-26 ENCOUNTER — Encounter (HOSPITAL_BASED_OUTPATIENT_CLINIC_OR_DEPARTMENT_OTHER): Payer: Self-pay | Admitting: *Deleted

## 2020-12-26 ENCOUNTER — Emergency Department (HOSPITAL_BASED_OUTPATIENT_CLINIC_OR_DEPARTMENT_OTHER)
Admission: EM | Admit: 2020-12-26 | Discharge: 2020-12-26 | Disposition: A | Discharge status: Home / Self Care | Payer: Medicaid Other | Attending: Emergency Medicine | Admitting: Emergency Medicine

## 2020-12-26 ENCOUNTER — Other Ambulatory Visit: Payer: Self-pay

## 2020-12-26 DIAGNOSIS — Z7982 Long term (current) use of aspirin: Secondary | ICD-10-CM | POA: Diagnosis not present

## 2020-12-26 DIAGNOSIS — R21 Rash and other nonspecific skin eruption: Secondary | ICD-10-CM | POA: Insufficient documentation

## 2020-12-26 MED ORDER — PREDNISONE 10 MG (21) PO TBPK: ORAL_TABLET | Freq: Every day | ORAL | 0 refills | Status: DC

## 2020-12-26 MED ORDER — PREDNISONE 50 MG PO TABS
60.0000 mg | ORAL_TABLET | Freq: Once | ORAL | Status: AC
  Administered 2020-12-26: 60 mg via ORAL
  Filled 2020-12-26: qty 1

## 2020-12-26 NOTE — Discharge Instructions (Signed)
It appears that you likely have poison ivy.  I have given you a dose of prednisone in the emergency department.  I am also prescribing you a prednisone taper.  This is a strong steroid.  Try to take it earlier in the day because it can be a stimulating medication and make it difficult to sleep.  You can also take Benadryl later in the day.  This will help with your itching, inflammation, as well as difficulty sleeping.  If your symptoms worsen, please return to the emergency department for reevaluation.  It was a pleasure to meet you.

## 2020-12-26 NOTE — ED Provider Notes (Signed)
[facility] EMERGENCY DEPARTMENT Provider Note   CSN: [id number] Arrival date & time: 12/26/20  2118     History Chief Complaint  Patient presents with  . Urticaria    Robyn Rivera is a 45 y.o. female.  HPI Patient is a 45 year old female with a medical history as noted below.  She presents today due to a rash.  She states her symptoms started yesterday morning along her eyebrows and right upper eyelid.  They began to worsen and spread down her right lateral neck as well as along the flexor surface of her right elbow.  She reports significant itching in the regions.  No drainage.  No involvement of the eye, blurry vision, eye pain.  No fevers, chills, difficulty swallowing, shortness of breath.    Past Medical History:  Diagnosis Date  . Abnormal Pap smear of cervix    Patient had a procedure to cervix- ?LEEP?  Marland Kitchen History of angina   . Pancreatitis     There are no problems to display for this patient.   Past Surgical History:  Procedure Laterality Date  . CHOLECYSTECTOMY    . HERNIA REPAIR     emergency repair surgery following ovarian cystectomy  . INCONTINENCE SURGERY     Stress Incontinence surgery per patient  . ovarian cystectomy Left 2016     OB History    Gravida  5   Para  5   Term  5   Preterm  0   AB  0   Living  5     SAB  0   IAB  0   Ectopic  0   Multiple  0   Live Births  5           No family history on file.  Social History   Tobacco Use  . Smoking status: Never Smoker  . Smokeless tobacco: Never Used  Substance Use Topics  . Alcohol use: No  . Drug use: No    Home Medications Prior to Admission medications   Medication Sig Start Date End Date Taking? Authorizing Provider  aspirin 325 MG EC tablet Take 325 mg by mouth daily.   Yes [provider]  Cetirizine HCl (ZYRTEC PO) Take by mouth.   Yes [provider]  Omega-3 Fatty Acids (FISH OIL) 1000 MG CAPS Take 2 capsules by mouth 2  (two) times daily. 05/07/20  Yes [provider]  predniSONE (STERAPRED UNI-PAK 21 TAB) 10 MG (21) TBPK tablet Take by mouth daily. Take 6 tabs by mouth daily  for 2 days, then 5 tabs for 2 days, then 4 tabs for 2 days, then 3 tabs for 2 days, 2 tabs for 2 days, then 1 tab by mouth daily for 2 days 12/26/20  Yes Placido Sou, PA-C  HYDROcodone-acetaminophen (NORCO/VICODIN) 5-325 MG tablet Take 1-2 tablets by mouth every 4 (four) hours as needed. 07/13/17   Benjiman Core, MD  ibuprofen (ADVIL,MOTRIN) 400 MG tablet Take 1 tablet (400 mg total) by mouth every 6 (six) hours as needed. 07/13/17   Benjiman Core, MD  methocarbamol (ROBAXIN) 500 MG tablet Take 1 tablet (500 mg total) by mouth 2 (two) times daily. 08/22/16   Palumbo, April, MD  naproxen (NAPROSYN) 375 MG tablet Take 1 tablet (375 mg total) by mouth 2 (two) times daily. 08/22/16   Palumbo, April, MD    Allergies    Penicillins  Review of Systems   Review of Systems  Constitutional: Negative for chills and  fever.  Respiratory: Negative for shortness of breath.   Cardiovascular: Negative for chest pain.  Skin: Positive for color change and rash.   Physical Exam Updated Vital Signs BP 130/82   Pulse 68   Temp 98.1 F (36.7 C) (Oral)   Resp 15   Ht 5\' 4"  (1.626 m)   Wt 66 kg   SpO2 98%   BMI 24.96 kg/m   Physical Exam Vitals and nursing note reviewed.  Constitutional:      General: She is not in acute distress.    Appearance: She is well-developed.  HENT:     Head: Normocephalic and atraumatic.     Right Ear: External ear normal.     Left Ear: External ear normal.  Eyes:     General: No scleral icterus.       Right eye: No discharge.        Left eye: No discharge.     Conjunctiva/sclera: Conjunctivae normal.  Neck:     Trachea: No tracheal deviation.  Cardiovascular:     Rate and Rhythm: Normal rate.  Pulmonary:     Effort: Pulmonary effort is normal. No respiratory distress.     Breath sounds:  No stridor.  Abdominal:     General: There is no distension.  Musculoskeletal:        General: No swelling or deformity.     Cervical back: Neck supple.  Skin:    General: Skin is warm and dry.     Findings: Erythema and rash present.     Comments: Erythematous vesicular rash noted to the lower forehead, eyebrows, right eyelid, right lateral neck, as well as the right flexor surface of the elbow.  Pruritic with visible excoriations.  Neurological:     Mental Status: She is alert.     Cranial Nerves: Cranial nerve deficit: no gross deficits.     ED Results / Procedures / Treatments   Labs (all labs ordered are listed, but only abnormal results are displayed) Labs Reviewed - No data to display  EKG None  Radiology No results found.  Procedures Procedures   Medications Ordered in ED Medications  predniSONE (DELTASONE) tablet 60 mg (60 mg Oral Given 12/26/20 2314)    ED Course  I have reviewed the triage vital signs and the nursing notes.  Pertinent labs & imaging results that were available during my care of the patient were reviewed by me and considered in my medical decision making (see chart for details).    MDM Rules/Calculators/A&P                          Patient presents today with a rash.  She reports itching at the sites but otherwise has no complaints.  No chest pain, shortness of breath, fevers, chills.  It appears vesicular and I believe it is likely poison ivy.  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  She states she has had similar rashes in the past that have responded well to prednisone.  Patient given a dose of prednisone in the emergency department and discharged on a prednisone taper.  No history of diabetes, per patient.  Also recommended continued use of Benadryl.  Discussed return precautions.  Her questions were answered and she was amicable at the time of discharge.  Final Clinical Impression(s) /  ED Diagnoses Final diagnoses:  Rash    Rx / DC Orders  ED Discharge Orders         Ordered    predniSONE (STERAPRED UNI-PAK 21 TAB) 10 MG (21) TBPK tablet  Daily        12/26/20 2310           Placido Sou, PA-C 12/26/20 2316    Vanetta Mulders, MD 01/01/21 [phone]

## 2020-12-26 NOTE — ED Triage Notes (Signed)
Hives on and off since yesterday. She has been taking Zyrtec.

## 2021-02-23 ENCOUNTER — Emergency Department (HOSPITAL_BASED_OUTPATIENT_CLINIC_OR_DEPARTMENT_OTHER)
Admission: EM | Admit: 2021-02-23 | Discharge: 2021-02-23 | Disposition: A | Discharge status: Home / Self Care | Payer: Medicaid Other | Attending: Emergency Medicine | Admitting: Emergency Medicine

## 2021-02-23 ENCOUNTER — Other Ambulatory Visit: Payer: Self-pay

## 2021-02-23 ENCOUNTER — Encounter (HOSPITAL_BASED_OUTPATIENT_CLINIC_OR_DEPARTMENT_OTHER): Payer: Self-pay

## 2021-02-23 DIAGNOSIS — L02212 Cutaneous abscess of back [any part, except buttock]: Secondary | ICD-10-CM | POA: Insufficient documentation

## 2021-02-23 DIAGNOSIS — M549 Dorsalgia, unspecified: Secondary | ICD-10-CM | POA: Diagnosis present

## 2021-02-23 LAB — CBG MONITORING, ED: Glucose-Capillary: 129 mg/dL — ABNORMAL HIGH (ref 70–99)

## 2021-02-23 MED ORDER — LIDOCAINE-EPINEPHRINE 1 %-1:100000 IJ SOLN
10.0000 mL | Freq: Once | INTRAMUSCULAR | Status: AC
  Administered 2021-02-23: 10 mL
  Filled 2021-02-23: qty 1

## 2021-02-23 MED ORDER — DOXYCYCLINE HYCLATE 100 MG PO CAPS: 100.0000 mg | ORAL_CAPSULE | Freq: Two times a day (BID) | ORAL | 0 refills | Status: DC

## 2021-02-23 MED ORDER — IBUPROFEN 800 MG PO TABS: 800.0000 mg | ORAL_TABLET | Freq: Three times a day (TID) | ORAL | 0 refills | Status: DC | PRN

## 2021-02-23 NOTE — ED Provider Notes (Signed)
[facility] EMERGENCY DEPARTMENT Provider Note   CSN: [id number] Arrival date & time: 02/23/21  0938     History Chief Complaint  Patient presents with  . Abscess    Robyn Rivera is a 45 y.o. female.  45 year old female with past medical history below who presents with skin infection.  3 days ago, patient noticed a mark on her right lateral back that initially looked like a bug bite although she does not recall a bite or sting.  The area has become progressively more painful and swollen along with redness.  Her friend was able to express some fluid which initially seemed to relieve symptoms but the swelling and pain has returned.  She reports feeling chilled yesterday and has had associated nausea.  The history is provided by the patient.  Abscess      Past Medical History:  Diagnosis Date  . Abnormal Pap smear of cervix    Patient had a procedure to cervix- ?LEEP?  Marland Kitchen History of angina   . Pancreatitis     There are no problems to display for this patient.   Past Surgical History:  Procedure Laterality Date  . CHOLECYSTECTOMY    . HERNIA REPAIR     emergency repair surgery following ovarian cystectomy  . INCONTINENCE SURGERY     Stress Incontinence surgery per patient  . ovarian cystectomy Left 2016     OB History    Gravida  5   Para  5   Term  5   Preterm  0   AB  0   Living  5     SAB  0   IAB  0   Ectopic  0   Multiple  0   Live Births  5           History reviewed. No pertinent family history.  Social History   Tobacco Use  . Smoking status: Never Smoker  . Smokeless tobacco: Never Used  Vaping Use  . Vaping Use: Never used  Substance Use Topics  . Alcohol use: No  . Drug use: No    Home Medications Prior to Admission medications   Medication Sig Start Date End Date Taking? Authorizing Provider  aspirin 325 MG EC tablet Take 325 mg by mouth daily.   Yes [provider]  doxycycline (VIBRAMYCIN) 100  MG capsule Take 1 capsule (100 mg total) by mouth 2 (two) times daily. 02/23/21  Yes Marchell Froman, Ambrose Finland, MD  ibuprofen (ADVIL) 800 MG tablet Take 1 tablet (800 mg total) by mouth every 8 (eight) hours as needed for moderate pain. 02/23/21  Yes Milagros Middendorf, Ambrose Finland, MD  Omega-3 Fatty Acids (FISH OIL) 1000 MG CAPS Take 2 capsules by mouth 2 (two) times daily. 05/07/20  Yes [provider]  Cetirizine HCl (ZYRTEC PO) Take by mouth.    [provider]    Allergies    Penicillins  Review of Systems   Review of Systems All other systems reviewed and are negative except that which was mentioned in HPI  Physical Exam Updated Vital Signs BP 107/62 (BP Location: Right Arm)   Pulse 71   Temp 97.7 F (36.5 C) (Oral)   Resp 18   Ht 5\' 4"  (1.626 m)   Wt 65.3 kg   LMP 02/16/2021   SpO2 100%   BMI 24.72 kg/m   Physical Exam Vitals and nursing note reviewed.  Constitutional:      General: She is not in acute distress.  Appearance: She is well-developed.  HENT:     Head: Normocephalic and atraumatic.  Eyes:     Conjunctiva/sclera: Conjunctivae normal.  Musculoskeletal:     Cervical back: Neck supple.       Back:  Skin:    General: Skin is warm and dry.     Comments: Area of erythema, induration, and tenderness w/ central black scab on R lateral thoracic back lateral to scapula  Neurological:     Mental Status: She is alert and oriented to person, place, and time.  Psychiatric:        Judgment: Judgment normal.     Comments: Uncomfortable, anxious     ED Results / Procedures / Treatments   Labs (all labs ordered are listed, but only abnormal results are displayed) Labs Reviewed  CBG MONITORING, ED - Abnormal; Notable for the following components:      Result Value   Glucose-Capillary 129 (*)    All other components within normal limits    EKG None  Radiology No results found.  Procedures .Marland KitchenIncision and Drainage  Date/Time: 02/23/2021 8:23  AM Performed by: Laurence Spates, MD Authorized by: Laurence Spates, MD   Consent:    Consent obtained:  Verbal   Consent given by:  Patient   Risks discussed:  Bleeding, incomplete drainage and pain (scarring)   Alternatives discussed:  No treatment Universal protocol:    Patient identity confirmed:  Verbally with patient Location:    Type:  Abscess   Size:  1 cm   Location:  Trunk   Trunk location:  Back Pre-procedure details:    Skin preparation:  Povidone-iodine Sedation:    Sedation type:  None Anesthesia:    Anesthesia method:  Local infiltration   Local anesthetic:  Lidocaine 1% WITH epi Procedure type:    Complexity:  Simple Procedure details:    Incision types:  Single straight   Incision depth:  Dermal   Wound management:  Probed and deloculated and irrigated with saline   Drainage:  Purulent   Drainage amount:  Copious   Wound treatment:  Wound left open   Packing materials:  None Post-procedure details:    Procedure completion:  Tolerated well, no immediate complications    EMERGENCY DEPARTMENT US SOFT TISSUE INTERPRETATION "Study: Limited Soft Tissue Ultrasound"  INDICATIONS: Soft tissue infection Multiple views of the body part were obtained in real-time with a multi-frequency linear probe  PERFORMED BY: Myself IMAGES ARCHIVED?: Yes SIDE:Right  BODY PART:Upper back INTERPRETATION:  Abcess present and Cellulitis present   Medications Ordered in ED Medications  lidocaine-EPINEPHrine (XYLOCAINE W/EPI) 1 %-1:100000 (with pres) injection 10 mL (10 mLs Other Given 02/23/21 0813)    ED Course  I have reviewed the triage vital signs and the nursing notes.  Pertinent labs & imaging results that were available during my care of the patient were reviewed by me and considered in my medical decision making (see chart for details).    MDM Rules/Calculators/A&P                          Afebrile on exam, ultrasound and exam consistent with  abscess with surrounding cellulitis.  I&D at bedside, see procedure note.  Will start on antibiotics due to surrounding cellulitis. She reports she just finished her menstrual period and has no hx of kidney disease, will give short course ibuprofen for pain. Discussed wound care including warm compresses.  Reviewed return precautions regarding signs or  symptoms of worsening infection.  She voiced understanding. Final Clinical Impression(s) / ED Diagnoses Final diagnoses:  Abscess of back    Rx / DC Orders ED Discharge Orders         Ordered    doxycycline (VIBRAMYCIN) 100 MG capsule  2 times daily        02/23/21 0822    ibuprofen (ADVIL) 800 MG tablet  Every 8 hours PRN        02/23/21 0822           Odyssey Vasbinder, Ambrose Finland, MD 02/23/21 [phone]

## 2021-02-23 NOTE — ED Triage Notes (Signed)
Insect bite/possible abscess to R middle back x 3 days.  Pt states she did have some relief after "cutting into it at home" but the pain is worse today.  Redness/swelling/warmth noted to site.

## 2021-08-27 ENCOUNTER — Emergency Department (HOSPITAL_BASED_OUTPATIENT_CLINIC_OR_DEPARTMENT_OTHER)
Admission: EM | Admit: 2021-08-27 | Discharge: 2021-08-27 | Disposition: A | Discharge status: Home / Self Care | Payer: Medicaid Other | Attending: Emergency Medicine | Admitting: Emergency Medicine

## 2021-08-27 ENCOUNTER — Encounter (HOSPITAL_BASED_OUTPATIENT_CLINIC_OR_DEPARTMENT_OTHER): Payer: Self-pay | Admitting: *Deleted

## 2021-08-27 ENCOUNTER — Emergency Department (HOSPITAL_BASED_OUTPATIENT_CLINIC_OR_DEPARTMENT_OTHER): Payer: Medicaid Other

## 2021-08-27 ENCOUNTER — Other Ambulatory Visit: Payer: Self-pay

## 2021-08-27 DIAGNOSIS — I25119 Atherosclerotic heart disease of native coronary artery with unspecified angina pectoris: Secondary | ICD-10-CM | POA: Diagnosis not present

## 2021-08-27 DIAGNOSIS — R102 Pelvic and perineal pain: Secondary | ICD-10-CM | POA: Insufficient documentation

## 2021-08-27 DIAGNOSIS — Z7982 Long term (current) use of aspirin: Secondary | ICD-10-CM | POA: Diagnosis not present

## 2021-08-27 DIAGNOSIS — N8003 Adenomyosis of the uterus: Secondary | ICD-10-CM

## 2021-08-27 DIAGNOSIS — N939 Abnormal uterine and vaginal bleeding, unspecified: Secondary | ICD-10-CM | POA: Diagnosis present

## 2021-08-27 LAB — COMPREHENSIVE METABOLIC PANEL
ALT: 25 U/L (ref 0–44)
AST: 23 U/L (ref 15–41)
Albumin: 4.6 g/dL (ref 3.5–5.0)
Alkaline Phosphatase: 76 U/L (ref 38–126)
Anion gap: 8 (ref 5–15)
BUN: 11 mg/dL (ref 6–20)
CO2: 22 mmol/L (ref 22–32)
Calcium: 9.1 mg/dL (ref 8.9–10.3)
Chloride: 104 mmol/L (ref 98–111)
Creatinine, Ser: 0.68 mg/dL (ref 0.44–1.00)
GFR, Estimated: >60 mL/min (ref >60)
Glucose, Bld: 109 mg/dL — ABNORMAL HIGH (ref 70–99)
Potassium: 3.6 mmol/L (ref 3.5–5.1)
Sodium: 134 mmol/L — ABNORMAL LOW (ref 135–145)
Total Bilirubin: 1.6 mg/dL — ABNORMAL HIGH (ref 0.3–1.2)
Total Protein: 7.7 g/dL (ref 6.5–8.1)

## 2021-08-27 LAB — CBC WITH DIFFERENTIAL/PLATELET
Abs Immature Granulocytes: 0.01 10*3/uL (ref 0.00–0.07)
Basophils Absolute: 0 10*3/uL (ref 0.0–0.1)
Basophils Relative: 0 %
Eosinophils Absolute: 0.1 10*3/uL (ref 0.0–0.5)
Eosinophils Relative: 1 %
HCT: 40 % (ref 36.0–46.0)
Hemoglobin: 14.2 g/dL (ref 12.0–15.0)
Immature Granulocytes: 0 %
Lymphocytes Relative: 27 %
Lymphs Abs: 1.3 10*3/uL (ref 0.7–4.0)
MCH: 31.3 pg (ref 26.0–34.0)
MCHC: 35.5 g/dL (ref 30.0–36.0)
MCV: 88.3 fL (ref 80.0–100.0)
Monocytes Absolute: 0.3 10*3/uL (ref 0.1–1.0)
Monocytes Relative: 7 %
Neutro Abs: 3.2 10*3/uL (ref 1.7–7.7)
Neutrophils Relative %: 65 %
Platelets: 187 10*3/uL (ref 150–400)
RBC: 4.53 MIL/uL (ref 3.87–5.11)
RDW: 13.2 % (ref 11.5–15.5)
WBC: 5 10*3/uL (ref 4.0–10.5)
nRBC: 0 % (ref 0.0–0.2)

## 2021-08-27 LAB — PREGNANCY, URINE: Preg Test, Ur: NEGATIVE

## 2021-08-27 LAB — URINALYSIS, ROUTINE W REFLEX MICROSCOPIC
Bilirubin Urine: NEGATIVE
Glucose, UA: NEGATIVE mg/dL
Ketones, ur: NEGATIVE mg/dL
Leukocytes,Ua: NEGATIVE
Nitrite: NEGATIVE
Protein, ur: NEGATIVE mg/dL
Specific Gravity, Urine: 1.02 (ref 1.005–1.030)
pH: 6 (ref 5.0–8.0)

## 2021-08-27 LAB — URINALYSIS, MICROSCOPIC (REFLEX): WBC, UA: NONE SEEN WBC/hpf (ref 0–5)

## 2021-08-27 MED ORDER — HYDROMORPHONE HCL 1 MG/ML IJ SOLN
1.0000 mg | Freq: Once | INTRAMUSCULAR | Status: AC
  Administered 2021-08-27: 1 mg via INTRAVENOUS
  Filled 2021-08-27: qty 1

## 2021-08-27 MED ORDER — KETOROLAC TROMETHAMINE 15 MG/ML IJ SOLN
15.0000 mg | Freq: Once | INTRAMUSCULAR | Status: AC
  Administered 2021-08-27: 15 mg via INTRAVENOUS
  Filled 2021-08-27: qty 1

## 2021-08-27 NOTE — ED Notes (Signed)
Pt care taken, waiting on results, hurting a 6 on pain scale, meds given. No other complaints at this time.

## 2021-08-27 NOTE — ED Triage Notes (Addendum)
C/o vaginal bleeding  with clots, lower right pelvic pain and lower back pain  x 1 hr ago LMP x 1 month

## 2021-08-27 NOTE — ED Provider Notes (Signed)
[facility] EMERGENCY DEPARTMENT Provider Note   CSN: [id number] Arrival date & time: 08/27/21  1444     History Chief Complaint  Patient presents with   Vaginal Bleeding    Robyn Rivera is a 45 y.o. female.  Presents to ER with concern for vaginal bleeding, pelvic pain.  Pain up to 6 on 10 in severity, cramping, sharp, worse in center of lower abdomen/pelvic region and a little bit on right side.  Reports that earlier today she think that she passed dark red/black looking clot versus tissue.  Then started having vaginal bleeding.  States that she has a couple days early for her regular menstrual cycle.  Does have long menstrual cycles normally.  Has a gynecologist.  No vaginal discharge.  She denies any chronic medical problems.   HPI     Past Medical History:  Diagnosis Date   Abnormal Pap smear of cervix    Patient had a procedure to cervix- ?LEEP?   History of angina    Pancreatitis     There are no problems to display for this patient.   Past Surgical History:  Procedure Laterality Date   CHOLECYSTECTOMY     HERNIA REPAIR     emergency repair surgery following ovarian cystectomy   INCONTINENCE SURGERY     Stress Incontinence surgery per patient   ovarian cystectomy Left 2016     OB History     Gravida  5   Para  5   Term  5   Preterm  0   AB  0   Living  5      SAB  0   IAB  0   Ectopic  0   Multiple  0   Live Births  5           No family history on file.  Social History   Tobacco Use   Smoking status: Never   Smokeless tobacco: Never  Vaping Use   Vaping Use: Never used  Substance Use Topics   Alcohol use: No   Drug use: No    Home Medications Prior to Admission medications   Medication Sig Start Date End Date Taking? Authorizing Provider  aspirin 325 MG EC tablet Take 325 mg by mouth daily.    [provider]  Cetirizine HCl (ZYRTEC PO) Take by mouth.    [provider]  doxycycline  (VIBRAMYCIN) 100 MG capsule Take 1 capsule (100 mg total) by mouth 2 (two) times daily. 02/23/21   Little, Wenda Overland, MD  ibuprofen (ADVIL) 800 MG tablet Take 1 tablet (800 mg total) by mouth every 8 (eight) hours as needed for moderate pain. 02/23/21   Little, Wenda Overland, MD  Omega-3 Fatty Acids (FISH OIL) 1000 MG CAPS Take 2 capsules by mouth 2 (two) times daily. 05/07/20   [provider]    Allergies    Penicillins  Review of Systems   Review of Systems  Constitutional:  Negative for chills and fever.  HENT:  Negative for ear pain and sore throat.   Eyes:  Negative for pain and visual disturbance.  Respiratory:  Negative for cough and shortness of breath.   Cardiovascular:  Negative for chest pain and palpitations.  Gastrointestinal:  Positive for abdominal pain. Negative for vomiting.  Genitourinary:  Positive for pelvic pain and vaginal bleeding. Negative for dysuria and hematuria.  Musculoskeletal:  Negative for arthralgias and back pain.  Skin:  Negative for color change and rash.  Neurological:  Negative for seizures and syncope.  All other systems reviewed and are negative.  Physical Exam Updated Vital Signs BP 129/87   Pulse 66   Temp 98.8 F (37.1 C) (Oral)   Resp 18   Ht 5\' 4"  (1.626 m)   Wt 63.5 kg   LMP 07/29/2021   SpO2 100%   BMI 24.03 kg/m   Physical Exam Vitals and nursing note reviewed.  Constitutional:      General: She is not in acute distress.    Appearance: She is well-developed.  HENT:     Head: Normocephalic and atraumatic.  Eyes:     Conjunctiva/sclera: Conjunctivae normal.  Cardiovascular:     Rate and Rhythm: Normal rate.     Pulses: Normal pulses.  Pulmonary:     Effort: Pulmonary effort is normal. No respiratory distress.  Abdominal:     Palpations: Abdomen is soft.     Tenderness: There is no abdominal tenderness.  Genitourinary:    Comments: Small blood noted in vaginal vault, cervix appears normal, no active bleeding  noted Musculoskeletal:        General: No deformity or signs of injury.     Cervical back: Neck supple.  Skin:    General: Skin is warm and dry.  Neurological:     General: No focal deficit present.     Mental Status: She is alert.  Psychiatric:        Mood and Affect: Mood normal.        Thought Content: Thought content normal.    ED Results / Procedures / Treatments   Labs (all labs ordered are listed, but only abnormal results are displayed) Labs Reviewed  URINALYSIS, ROUTINE W REFLEX MICROSCOPIC - Abnormal; Notable for the following components:      Result Value   Hgb urine dipstick SMALL (*)    All other components within normal limits  URINALYSIS, MICROSCOPIC (REFLEX) - Abnormal; Notable for the following components:   Bacteria, UA FEW (*)    All other components within normal limits  COMPREHENSIVE METABOLIC PANEL - Abnormal; Notable for the following components:   Sodium 134 (*)    Glucose, Bld 109 (*)    Total Bilirubin 1.6 (*)    All other components within normal limits  PREGNANCY, URINE  CBC WITH DIFFERENTIAL/PLATELET    EKG None  Radiology US PELVIC COMPLETE W TRANSVAGINAL AND TORSION R/O  Result Date: 08/27/2021 CLINICAL DATA:  Right lower pelvic pain, vaginal bleeding EXAM: TRANSABDOMINAL AND TRANSVAGINAL ULTRASOUND OF PELVIS DOPPLER ULTRASOUND OF OVARIES TECHNIQUE: Both transabdominal and transvaginal ultrasound examinations of the pelvis were performed. Transabdominal technique was performed for global imaging of the pelvis including uterus, ovaries, adnexal regions, and pelvic cul-de-sac. It was necessary to proceed with endovaginal exam following the transabdominal exam to visualize the uterus, endometrium, ovaries and adnexa. Color and duplex Doppler ultrasound was utilized to evaluate blood flow to the ovaries. COMPARISON:  07/13/2017 FINDINGS: Uterus Measurements: 7.6 x 5.2 x 5.6 cm = volume: 118 mL. Heterogeneous echotexture. No mass. Endometrium  Thickness: Normal thickness, 3 mm.  No focal abnormality visualized. Right ovary Measurements: 4.1 x 1.7 x 3.7 cm = volume: 13.7 mL. Normal appearance/no adnexal mass. Left ovary Measurements: 1.6 x 1.0 x 1.6 cm = volume: 1.3 mL. Prior partial oophorectomy. No adnexal mass. Pulsed Doppler evaluation of both ovaries demonstrates normal low-resistance arterial and venous waveforms. Other findings No abnormal free fluid. IMPRESSION: Heterogeneous echotexture throughout the uterus which can be seen with adenomyosis. No focal  fibroid. No acute findings. Electronically Signed   By: Charlett Nose M.D.   On: 08/27/2021 18:48    Procedures Procedures   Medications Ordered in ED Medications  ketorolac (TORADOL) 15 MG/ML injection 15 mg (15 mg Intravenous Given 08/27/21 1703)  HYDROmorphone (DILAUDID) injection 1 mg (1 mg Intravenous Given 08/27/21 1919)    ED Course  I have reviewed the triage vital signs and the nursing notes.  Pertinent labs & imaging results that were available during my care of the patient were reviewed by me and considered in my medical decision making (see chart for details).    MDM Rules/Calculators/A&P                          45 year old lady presents to ER with concern for vaginal bleeding and pelvic pain.  On exam she appears well in no distress.  Vital signs are stable, no tachycardia or fever.  Abdomen is soft and nontender.  She does have some small blood in vaginal vault.  Cervix appears grossly normal.  Pregnancy negative.  CBC with stable hemoglobin.  Abdomen soft, no leukocytosis, doubt acute abdominal process.  Ultrasound negative for acute gynecologic abnormality, radiologist did comment on heterogeneous echotexture throughout the uterus which could be seen with adenomyosis.  Recommend patient take NSAIDs for now and follow-up with her gynecologist closely.  Reviewed return precautions and discharged home.   After the discussed management above, the patient was  determined to be safe for discharge.  The patient was in agreement with this plan and all questions regarding their care were answered.  ED return precautions were discussed and the patient will return to the ED with any significant worsening of condition.  Final Clinical Impression(s) / ED Diagnoses Final diagnoses:  Vaginal bleeding    Rx / DC Orders ED Discharge Orders     None        Milagros Loll, MD 08/27/21 1945

## 2021-08-27 NOTE — Discharge Instructions (Addendum)
Follow-up with your gynecologist and your primary care doctor.  Recommend taking anti-inflammatory such as Motrin or naproxen.  If you have worsening pain, fever, vomiting or other new concerning symptom, back to ER for reassessment.

## 2021-08-27 NOTE — ED Notes (Signed)
Pt states that  she  had a light period the last 2 months  of only 2 days  when normally she has 7 days , today she passed  a clot and what she said was tissue ? , Golf ball size felt bad after she states

## 2022-02-15 ENCOUNTER — Emergency Department (HOSPITAL_BASED_OUTPATIENT_CLINIC_OR_DEPARTMENT_OTHER)
Admission: EM | Admit: 2022-02-15 | Discharge: 2022-02-15 | Disposition: A | Discharge status: Home / Self Care | Payer: Medicaid Other | Attending: Emergency Medicine | Admitting: Emergency Medicine

## 2022-02-15 ENCOUNTER — Encounter (HOSPITAL_BASED_OUTPATIENT_CLINIC_OR_DEPARTMENT_OTHER): Payer: Self-pay | Admitting: Emergency Medicine

## 2022-02-15 ENCOUNTER — Other Ambulatory Visit: Payer: Self-pay

## 2022-02-15 DIAGNOSIS — Z7982 Long term (current) use of aspirin: Secondary | ICD-10-CM | POA: Diagnosis not present

## 2022-02-15 DIAGNOSIS — Z20822 Contact with and (suspected) exposure to covid-19: Secondary | ICD-10-CM | POA: Diagnosis not present

## 2022-02-15 DIAGNOSIS — R059 Cough, unspecified: Secondary | ICD-10-CM | POA: Diagnosis present

## 2022-02-15 DIAGNOSIS — J069 Acute upper respiratory infection, unspecified: Secondary | ICD-10-CM | POA: Insufficient documentation

## 2022-02-15 LAB — GROUP A STREP BY PCR: Group A Strep by PCR: NOT DETECTED

## 2022-02-15 LAB — RESP PANEL BY RT-PCR (FLU A&B, COVID) ARPGX2
Influenza A by PCR: NEGATIVE
Influenza B by PCR: NEGATIVE
SARS Coronavirus 2 by RT PCR: NEGATIVE

## 2022-02-15 MED ORDER — ACETAMINOPHEN 325 MG PO TABS
650.0000 mg | ORAL_TABLET | Freq: Once | ORAL | Status: AC
  Administered 2022-02-15: 650 mg via ORAL
  Filled 2022-02-15: qty 2

## 2022-02-15 MED ORDER — ONDANSETRON 8 MG PO TBDP: 8.0000 mg | ORAL_TABLET | Freq: Three times a day (TID) | ORAL | 0 refills | Status: DC | PRN

## 2022-02-15 MED ORDER — ONDANSETRON 4 MG PO TBDP
8.0000 mg | ORAL_TABLET | Freq: Once | ORAL | Status: AC
  Administered 2022-02-15: 8 mg via ORAL
  Filled 2022-02-15: qty 2

## 2022-02-15 MED ORDER — AEROCHAMBER PLUS FLO-VU MISC
1.0000 | Freq: Once | Status: AC
  Administered 2022-02-15: 1
  Filled 2022-02-15: qty 1

## 2022-02-15 MED ORDER — ALBUTEROL SULFATE HFA 108 (90 BASE) MCG/ACT IN AERS
2.0000 | INHALATION_SPRAY | RESPIRATORY_TRACT | Status: DC | PRN
  Administered 2022-02-15: 2 via RESPIRATORY_TRACT
  Filled 2022-02-15: qty 6.7

## 2022-02-15 NOTE — ED Triage Notes (Signed)
Pt with cough, fever, sore throat and body aches.  ?

## 2022-02-15 NOTE — ED Provider Notes (Signed)
? ?[facility] ?Provider Note: Lowella Dell, MD, FACEP ? ?CSN: [id number] ?MRN: [id number] ?ARRIVAL: 02/15/22 at 0417 ?ROOM: [facility] ? ? ?CHIEF COMPLAINT  ?URI ? ? ?HISTORY OF PRESENT ILLNESS  ?02/15/22 5:54 AM ?Robyn Rivera is a 46 y.o. female with 3 days of sore throat, fever, nasal congestion, cough, and general malaise.  She has had nausea but no vomiting or diarrhea.  Her temperature on arrival here was 101.7 and she was given acetaminophen.  She rates her throat pain is an 8 out of 10, worse with swallowing.  Yesterday she was having shortness of breath with wheezing. ? ? ?Past Medical History:  ?Diagnosis Date  ? Abnormal Pap smear of cervix   ? Patient had a procedure to cervix- ?LEEP?  ? History of angina   ? Pancreatitis   ? ? ?Past Surgical History:  ?Procedure Laterality Date  ? CHOLECYSTECTOMY    ? HERNIA REPAIR    ? emergency repair surgery following ovarian cystectomy  ? INCONTINENCE SURGERY    ? Stress Incontinence surgery per patient  ? ovarian cystectomy Left 2016  ? ? ?History reviewed. No pertinent family history. ? ?Social History  ? ?Tobacco Use  ? Smoking status: Never  ? Smokeless tobacco: Never  ?Vaping Use  ? Vaping Use: Never used  ?Substance Use Topics  ? Alcohol use: No  ? Drug use: No  ? ? ?Prior to Admission medications   ?Medication Sig Start Date End Date Taking? Authorizing Provider  ?aspirin 325 MG EC tablet Take 325 mg by mouth daily.    [provider]  ?Cetirizine HCl (ZYRTEC PO) Take by mouth.    [provider]  ?Omega-3 Fatty Acids (FISH OIL) 1000 MG CAPS Take 2 capsules by mouth 2 (two) times daily. 05/07/20   [provider]  ? ? ?Allergies ?Patient has no active allergies. ? ? ?REVIEW OF SYSTEMS  ?Negative except as noted here or in the History of Present Illness. ? ? ?PHYSICAL EXAMINATION  ?Initial Vital Signs ?Blood pressure 110/77, pulse 96, temperature (!) 101.7 ?F (38.7 ?C), temperature source Oral, resp. rate 18, height 5\' 4"   (1.626 m), weight 66.2 kg, SpO2 100 %. ? ?Examination ?General: Well-developed, well-nourished female in no acute distress; appearance consistent with age of record ?HENT: normocephalic; atraumatic; pharyngeal erythema without exudate ?Eyes: pupils equal, round and reactive to light; extraocular muscles intact ?Neck: supple ?Heart: regular rate and rhythm ?Lungs: Mild coarse sounds bilateral ?Abdomen: soft; nondistended; nontender; bowel sounds present ?Extremities: No deformity; full range of motion; pulses normal ?Neurologic: Awake, alert and oriented; motor function intact in all extremities and symmetric; no facial droop ?Skin: Warm and dry ?Psychiatric: Normal mood and affect ? ? ?RESULTS  ?Summary of this visit's results, reviewed and interpreted by myself: ? ? EKG Interpretation ? ?Date/Time:    ?Ventricular Rate:    ?PR Interval:    ?QRS Duration:   ?QT Interval:    ?QTC Calculation:   ?R Axis:     ?Text Interpretation:   ?  ? ?  ? ?Laboratory Studies: ?Results for orders placed or performed during the hospital encounter of 02/15/22 (from the past 24 hour(s))  ?Resp Panel by RT-PCR (Flu A&B, Covid) Nasopharyngeal Swab     Status: None  ? Collection Time: 02/15/22  4:41 AM  ? Specimen: Nasopharyngeal Swab; Nasopharyngeal(NP) swabs in vial transport medium  ?Result Value Ref Range  ? SARS Coronavirus 2 by RT PCR NEGATIVE NEGATIVE  ? Influenza A by PCR  NEGATIVE NEGATIVE  ? Influenza B by PCR NEGATIVE NEGATIVE  ?Group A Strep by PCR     Status: None  ? Collection Time: 02/15/22  6:06 AM  ? Specimen: Throat; Sterile Swab  ?Result Value Ref Range  ? Group A Strep by PCR NOT DETECTED NOT DETECTED  ? ?Imaging Studies: ?No results found. ? ?ED COURSE and MDM  ?Nursing notes, initial and subsequent vitals signs, including pulse oximetry, reviewed and interpreted by myself. ? ?Vitals:  ? 02/15/22 0436  ?BP: 110/77  ?Pulse: 96  ?Resp: 18  ?Temp: (!) 101.7 ?F (38.7 ?C)  ?TempSrc: Oral  ?SpO2: 100%  ?Weight: 66.2 kg   ?Height: 5\' 4"  (1.626 m)  ? ?Medications  ?albuterol (VENTOLIN HFA) 108 (90 Base) MCG/ACT inhaler 2 puff (2 puffs Inhalation Given 02/15/22 0629)  ?acetaminophen (TYLENOL) tablet 650 mg (650 mg Oral Given 02/15/22 0443)  ?aerochamber plus with mask device 1 each (1 each Other Given 02/15/22 0630)  ?ondansetron (ZOFRAN-ODT) disintegrating tablet 8 mg (8 mg Oral Given 02/15/22 04/17/22)  ? ?No strep detected.  Her presentation is consistent with a viral illness although COVID and influenza tests are negative.  We will provide her with an albuterol inhaler and AeroChamber and instructed in their use.  We will also provide her an antinausea medication. ? ? ?PROCEDURES  ?Procedures ? ? ?ED DIAGNOSES  ? ?  ICD-10-CM   ?1. Viral URI with cough  J06.9   ?  ? ? ? ?  ?0981, MD ?02/15/22 [phone] ? ?

## 2022-09-29 IMAGING — US US PELVIS COMPLETE TRANSABD/TRANSVAG W DUPLEX
1 series · 13 of 25 positions shown · non-contrast
Comparison: 07/13/2017

CLINICAL DATA: Right lower pelvic pain, vaginal bleeding

EXAM:
TRANSABDOMINAL AND TRANSVAGINAL ULTRASOUND OF PELVIS
DOPPLER ULTRASOUND OF OVARIES
TECHNIQUE: Both transabdominal and transvaginal ultrasound examinations of the
pelvis were performed. Transabdominal technique was performed for
global imaging of the pelvis including uterus, ovaries, adnexal
regions, and pelvic cul-de-sac.
It was necessary to proceed with endovaginal exam following the
transabdominal exam to visualize the uterus, endometrium, ovaries
and adnexa. Color and duplex Doppler ultrasound was utilized to
evaluate blood flow to the ovaries.

[Series 1: us pelvis complete transabd/transvag w duplex · 91 acquisitions, 13 frames shown]
[im 1/91]
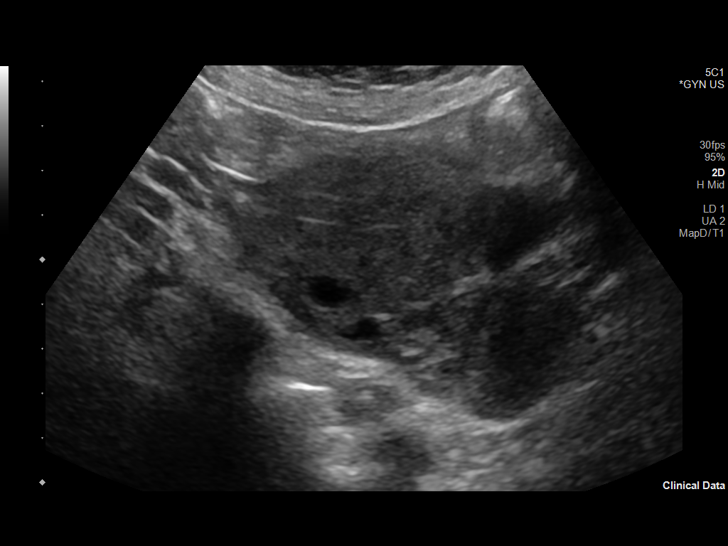
[im 8/91]
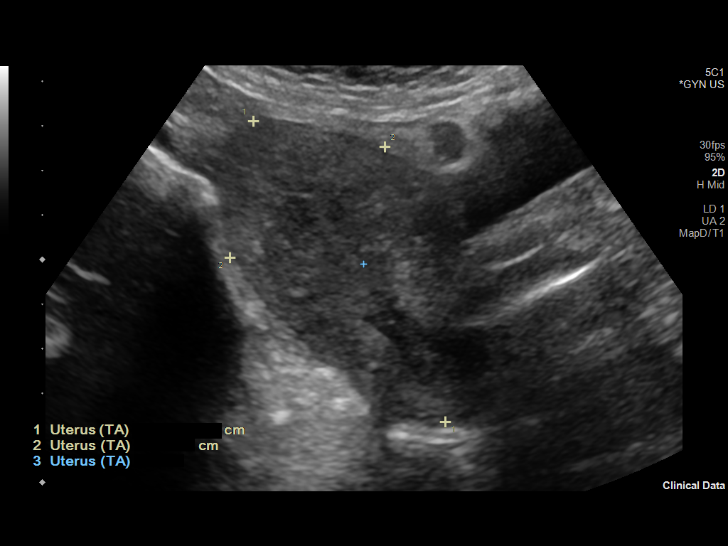
[im 16/91]
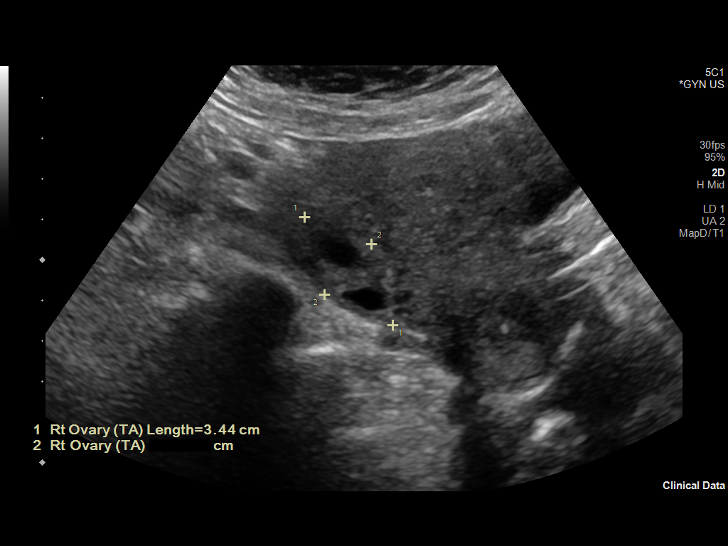
[im 23/91]
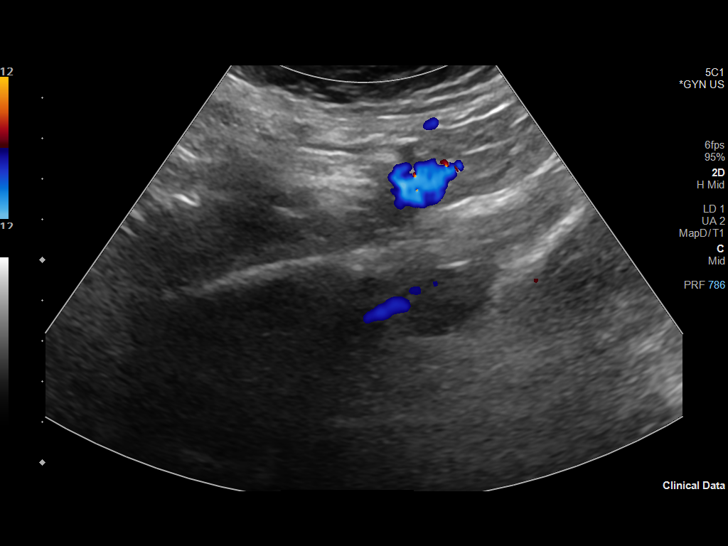
[im 31/91]
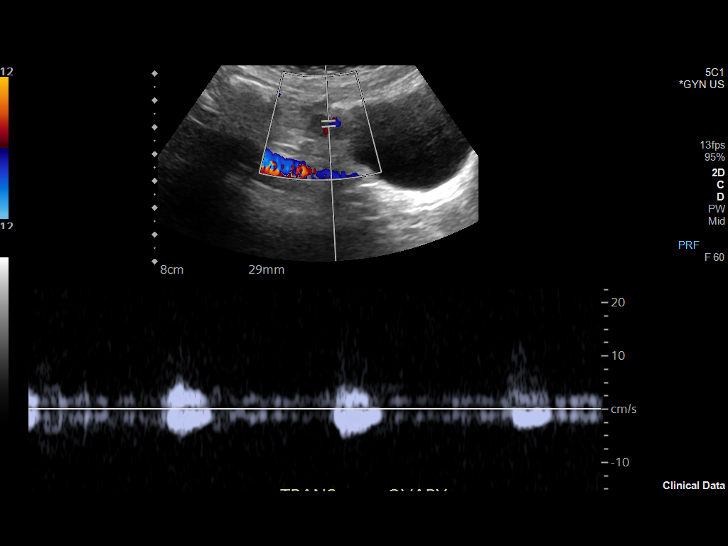
[im 38/91]
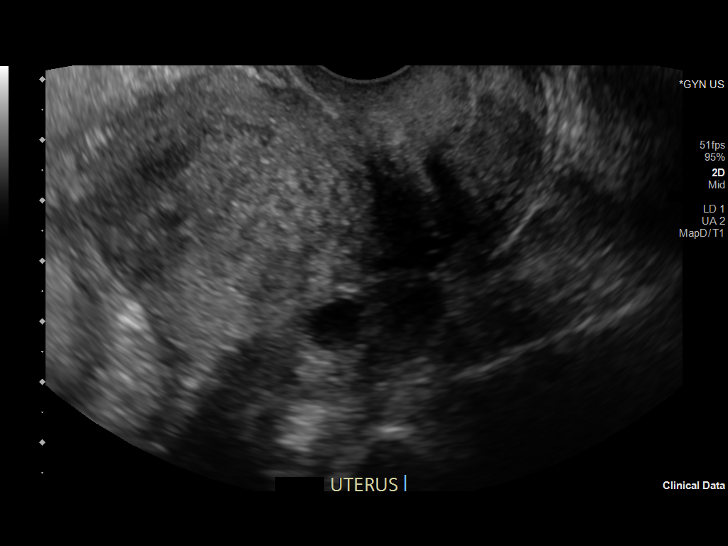
[im 46/91]
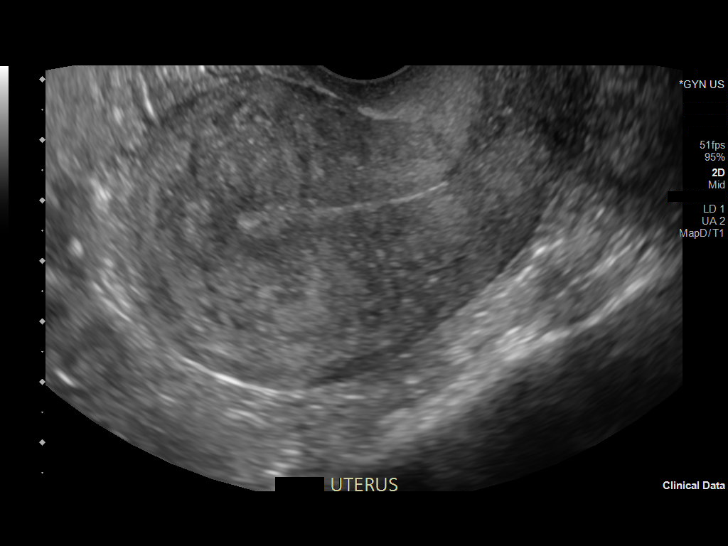
[im 53/91]
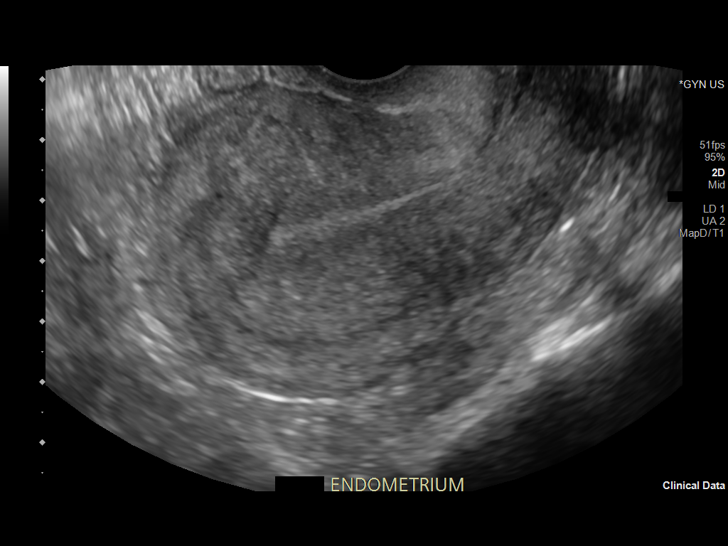
[im 61/91]
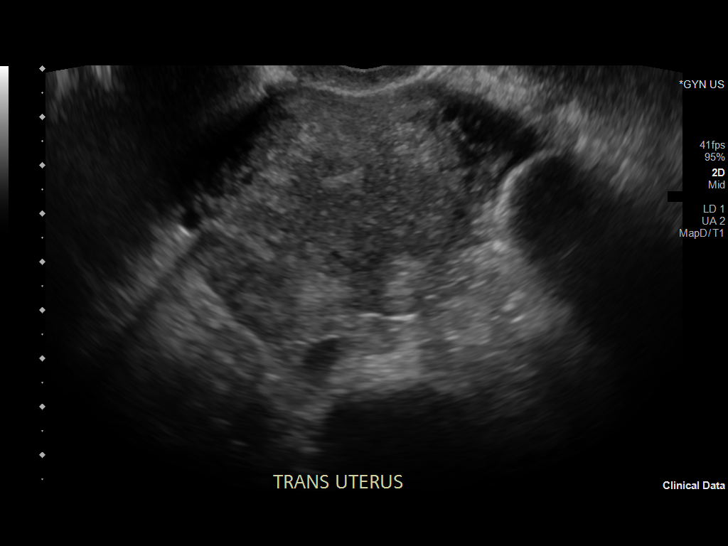
[im 68/91]
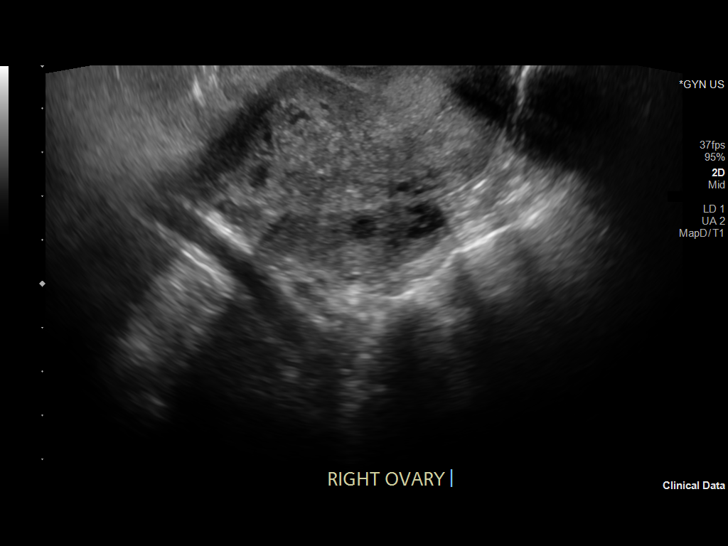
[im 76/91]
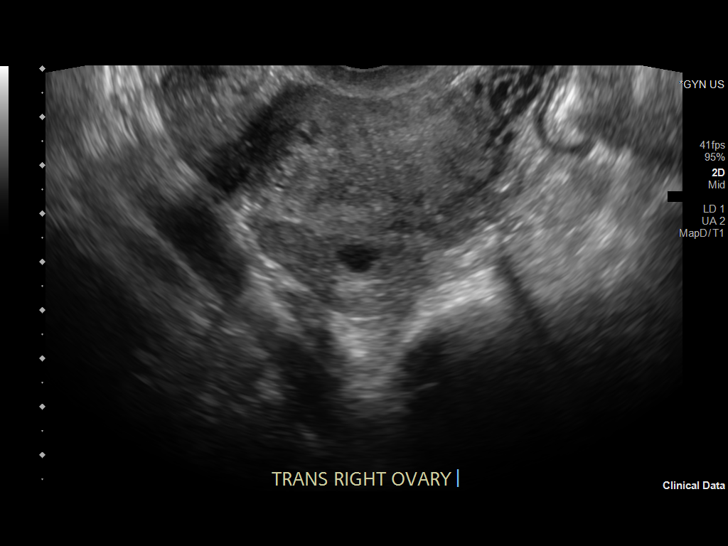
[im 83/91]
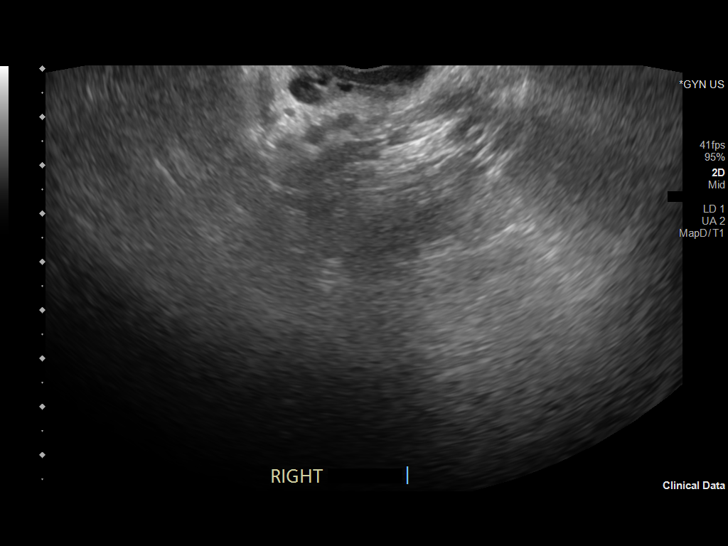
[im 91/91]
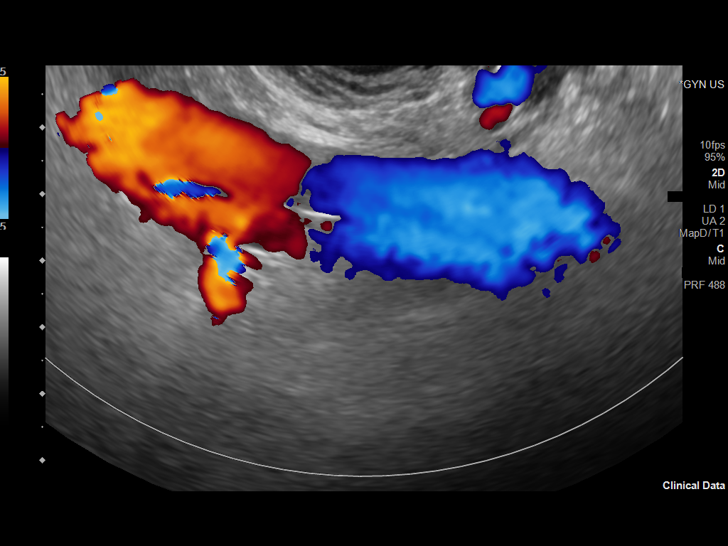

[13 of 25 positions shown; findings below may reference images not displayed]

FINDINGS: Uterus

Measurements: 7.6 x 5.2 x 5.6 cm = volume: 118 mL. Heterogeneous
echotexture. No mass.

Endometrium

Thickness: Normal thickness, 3 mm.  No focal abnormality visualized.

Right ovary

Measurements: 4.1 x 1.7 x 3.7 cm = volume: 13.7 mL. Normal
appearance/no adnexal mass.

Left ovary

Measurements: 1.6 x 1.0 x 1.6 cm = volume: 1.3 mL. Prior partial
oophorectomy. No adnexal mass.

Pulsed Doppler evaluation of both ovaries demonstrates normal
low-resistance arterial and venous waveforms.

Other findings

No abnormal free fluid.
IMPRESSION: Heterogeneous echotexture throughout the uterus which can be seen
with adenomyosis. No focal fibroid.

No acute findings.

## 2022-10-07 ENCOUNTER — Emergency Department (HOSPITAL_BASED_OUTPATIENT_CLINIC_OR_DEPARTMENT_OTHER)
Admission: EM | Admit: 2022-10-07 | Discharge: 2022-10-07 | Disposition: A | Discharge status: Home / Self Care | Payer: Medicaid Other | Attending: Emergency Medicine | Admitting: Emergency Medicine

## 2022-10-07 ENCOUNTER — Other Ambulatory Visit: Payer: Self-pay

## 2022-10-07 DIAGNOSIS — Z7982 Long term (current) use of aspirin: Secondary | ICD-10-CM | POA: Insufficient documentation

## 2022-10-07 DIAGNOSIS — R051 Acute cough: Secondary | ICD-10-CM

## 2022-10-07 DIAGNOSIS — R110 Nausea: Secondary | ICD-10-CM

## 2022-10-07 DIAGNOSIS — J111 Influenza due to unidentified influenza virus with other respiratory manifestations: Secondary | ICD-10-CM | POA: Insufficient documentation

## 2022-10-07 DIAGNOSIS — Z1152 Encounter for screening for COVID-19: Secondary | ICD-10-CM | POA: Insufficient documentation

## 2022-10-07 LAB — RESP PANEL BY RT-PCR (RSV, FLU A&B, COVID)  RVPGX2
Influenza A by PCR: POSITIVE — AB
Influenza B by PCR: NEGATIVE
Resp Syncytial Virus by PCR: NEGATIVE
SARS Coronavirus 2 by RT PCR: NEGATIVE

## 2022-10-07 MED ORDER — OSELTAMIVIR PHOSPHATE 75 MG PO CAPS: 75.0000 mg | ORAL_CAPSULE | Freq: Two times a day (BID) | ORAL | 0 refills | Status: DC

## 2022-10-07 MED ORDER — ONDANSETRON HCL 4 MG PO TABS: 4.0000 mg | ORAL_TABLET | Freq: Four times a day (QID) | ORAL | 0 refills | Status: DC

## 2022-10-07 MED ORDER — ACETAMINOPHEN 325 MG PO TABS
650.0000 mg | ORAL_TABLET | Freq: Once | ORAL | Status: AC
  Administered 2022-10-07: 650 mg via ORAL
  Filled 2022-10-07: qty 2

## 2022-10-07 MED ORDER — PROMETHAZINE-DM 6.25-15 MG/5ML PO SYRP: 2.5000 mL | ORAL_SOLUTION | Freq: Four times a day (QID) | ORAL | 0 refills | Status: DC | PRN

## 2022-10-07 NOTE — ED Provider Notes (Signed)
[facility] EMERGENCY DEPARTMENT Provider Note   CSN: [id number] Arrival date & time: 10/07/22  6144     History  Chief Complaint  Patient presents with   Cough    Robyn Rivera is a 46 y.o. female with noncontributory past medical history presents with concern for 2 days of cough, body aches, difficulty breathing with cough, intermittent nausea, sore throat.  Patient reports some possible sick contacts but is unsure.  She did not receive COVID, flu shot this year.   Cough Associated symptoms: sore throat        Home Medications Prior to Admission medications   Medication Sig Start Date End Date Taking? Authorizing Provider  ondansetron (ZOFRAN) 4 MG tablet Take 1 tablet (4 mg total) by mouth every 6 (six) hours. 10/07/22  Yes Amorita Vanrossum H, PA-C  oseltamivir (TAMIFLU) 75 MG capsule Take 1 capsule (75 mg total) by mouth every 12 (twelve) hours. 10/07/22  Yes Hae Ahlers H, PA-C  promethazine-dextromethorphan (PROMETHAZINE-DM) 6.25-15 MG/5ML syrup Take 2.5 mLs by mouth 4 (four) times daily as needed for cough. 10/07/22  Yes Madisun Hargrove H, PA-C  aspirin 325 MG EC tablet Take 325 mg by mouth daily.    [provider]  Cetirizine HCl (ZYRTEC PO) Take by mouth.    [provider]  Omega-3 Fatty Acids (FISH OIL) 1000 MG CAPS Take 2 capsules by mouth 2 (two) times daily. 05/07/20   [provider]  ondansetron (ZOFRAN-ODT) 8 MG disintegrating tablet Take 1 tablet (8 mg total) by mouth every 8 (eight) hours as needed for nausea or vomiting. 02/15/22   Molpus, John, MD      Allergies    Penicillins    Review of Systems   Review of Systems  HENT:  Positive for sore throat.   Respiratory:  Positive for cough.   Gastrointestinal:  Positive for nausea.  All other systems reviewed and are negative.   Physical Exam Updated Vital Signs BP (!) 140/80   Pulse 96   Temp (!) 102.4 F (39.1 C) (Oral)   Resp 19   SpO2 98%   Physical Exam Vitals and nursing note reviewed.  Constitutional:      General: She is not in acute distress.    Appearance: Normal appearance.  HENT:     Head: Normocephalic and atraumatic.     Mouth/Throat:     Comments: minimal posterior oropharynx erythema without swelling, exudate. Uvula midline, tonsils 1+ bilaterally.  No trismus, stridor, evidence of PTA, floor of mouth swelling or redness.   Eyes:     General:        Right eye: No discharge.        Left eye: No discharge.  Cardiovascular:     Rate and Rhythm: Normal rate and regular rhythm.     Heart sounds: No murmur heard.    No friction rub. No gallop.  Pulmonary:     Effort: Pulmonary effort is normal.     Breath sounds: Normal breath sounds.  Abdominal:     General: Bowel sounds are normal.     Palpations: Abdomen is soft.  Skin:    General: Skin is warm and dry.     Capillary Refill: Capillary refill takes less than 2 seconds.     Comments: Warm to the touch, febrile  Neurological:     Mental Status: She is alert and oriented to person, place, and time.  Psychiatric:        Mood and Affect: Mood  normal.        Behavior: Behavior normal.     ED Results / Procedures / Treatments   Labs (all labs ordered are listed, but only abnormal results are displayed) Labs Reviewed  RESP PANEL BY RT-PCR (RSV, FLU A&B, COVID)  [id number] - Abnormal; Notable for the following components:      Result Value   Influenza A by PCR POSITIVE (*)    All other components within normal limits    EKG None  Radiology No results found.  Procedures Procedures    Medications Ordered in ED Medications  acetaminophen (TYLENOL) tablet 650 mg (650 mg Oral Given 10/07/22 0954)    ED Course/ Medical Decision Making/ A&P                           Medical Decision Making Risk OTC drugs.   This is a well-appearing 46 year old female who presents with concern for 2 days of cough, fever, sore throat, headache, with some  intermittent nausea.  My emergent differential diagnosis includes acute upper respiratory infection with COVID, flu, RSV versus new asthma presentation, acute bronchitis, less clinical concern for pneumonia.  Also considered other ENT emergencies, Ludwig angina, strep pharyngitis, mono, versus epiglottis, tonsillitis versus other.  This is not an exhaustive differential.  On my exam patient is overall well-appearing, they have temperature of 102.4, breathing unlabored, no tachypnea, no respiratory distress, stable oxygen saturation.  Patient without tachycardia. RVP independently reviewed by myself shows flu a.  Patient symptoms are consistent with flu a encouraged ibuprofen, Tylenol, rest, plenty of fluids.  Given patient is within 2 days of symptoms, will discharge with Tamiflu after discussion of side effects related to Tamiflu, as well as Zofran for nausea, promethazine-dextromethorphan, for sore throat, cough discussed extensive return precautions.  Patient discharged in stable condition at this time.  Final Clinical Impression(s) / ED Diagnoses Final diagnoses:  Flu  Acute cough  Nausea    Rx / DC Orders ED Discharge Orders          Ordered    ondansetron (ZOFRAN) 4 MG tablet  Every 6 hours        10/07/22 1224    oseltamivir (TAMIFLU) 75 MG capsule  Every 12 hours        10/07/22 1224    promethazine-dextromethorphan (PROMETHAZINE-DM) 6.25-15 MG/5ML syrup  4 times daily PRN        10/07/22 1224              Traevon Meiring, Harrel Carina, PA-C 10/07/22 1229    Benjiman Core, MD 10/08/22 [phone]

## 2022-10-07 NOTE — Discharge Instructions (Signed)
You have the flu this is a viral infection that will likely start to improve after 7-10 days, antibiotics are not helpful in treating viral infections.  You may take Tamiflu twice a day for the next 5 days this will help shorten the duration of the flu and prevent complications, this medication can cause some upset stomach, nausea and diarrhea. You may use Zofran as needed for nausea. Since your symptoms have been present for more than 2 days Tamiflu will give no additional benefit.  Please make sure you are drinking plenty of fluids. You can treat your symptoms supportively with tylenol 650 mg/1000mg and ibuprofen 600 mg every 6 hours for fevers and pains. For nasal congestion you can use Zyrtec and Flonase to help with nasal congestion. To treat cough you can use over the counter cough medications such as Mucinex DM or Robitussin and throat lozenges. If your symptoms are not improving please follow up with you Primary doctor.   If you develop persistent fevers, shortness of breath or difficulty breathing, chest pain, severe headache and neck pain, persistent nausea and vomiting or other new or concerning symptoms return to the Emergency department.  

## 2022-10-07 NOTE — ED Notes (Signed)
Discharge instructions reviewed with patient. Patient verbalizes understanding, no further questions at this time. Medications/prescriptions and follow up information provided. No acute distress noted at time of departure.  

## 2022-10-07 NOTE — ED Triage Notes (Signed)
Patient presents to ED via POV from home. Here with cough, fever, chills and generalized body aches.

## 2023-09-05 ENCOUNTER — Emergency Department (HOSPITAL_BASED_OUTPATIENT_CLINIC_OR_DEPARTMENT_OTHER): Payer: Medicaid Other

## 2023-09-05 ENCOUNTER — Inpatient Hospital Stay (HOSPITAL_BASED_OUTPATIENT_CLINIC_OR_DEPARTMENT_OTHER)
Admission: EM | Admit: 2023-09-05 | Discharge: 2023-09-09 | DRG: 439 | Disposition: A | Discharge status: Home / Self Care | Payer: Medicaid Other | Attending: Internal Medicine | Admitting: Internal Medicine

## 2023-09-05 ENCOUNTER — Inpatient Hospital Stay (HOSPITAL_COMMUNITY): Payer: Medicaid Other

## 2023-09-05 ENCOUNTER — Encounter (HOSPITAL_BASED_OUTPATIENT_CLINIC_OR_DEPARTMENT_OTHER): Payer: Self-pay | Admitting: Emergency Medicine

## 2023-09-05 ENCOUNTER — Other Ambulatory Visit: Payer: Self-pay

## 2023-09-05 DIAGNOSIS — E119 Type 2 diabetes mellitus without complications: Secondary | ICD-10-CM

## 2023-09-05 DIAGNOSIS — K760 Fatty (change of) liver, not elsewhere classified: Secondary | ICD-10-CM | POA: Diagnosis present

## 2023-09-05 DIAGNOSIS — K859 Acute pancreatitis without necrosis or infection, unspecified: Secondary | ICD-10-CM | POA: Diagnosis present

## 2023-09-05 DIAGNOSIS — K858 Other acute pancreatitis without necrosis or infection: Secondary | ICD-10-CM | POA: Diagnosis present

## 2023-09-05 DIAGNOSIS — Z7982 Long term (current) use of aspirin: Secondary | ICD-10-CM | POA: Diagnosis not present

## 2023-09-05 DIAGNOSIS — E1165 Type 2 diabetes mellitus with hyperglycemia: Secondary | ICD-10-CM

## 2023-09-05 DIAGNOSIS — R739 Hyperglycemia, unspecified: Secondary | ICD-10-CM

## 2023-09-05 DIAGNOSIS — E872 Acidosis, unspecified: Secondary | ICD-10-CM | POA: Diagnosis present

## 2023-09-05 DIAGNOSIS — Z7984 Long term (current) use of oral hypoglycemic drugs: Secondary | ICD-10-CM | POA: Diagnosis not present

## 2023-09-05 DIAGNOSIS — E7800 Pure hypercholesterolemia, unspecified: Secondary | ICD-10-CM

## 2023-09-05 DIAGNOSIS — E876 Hypokalemia: Secondary | ICD-10-CM | POA: Diagnosis present

## 2023-09-05 DIAGNOSIS — Z5971 Insufficient health insurance coverage: Secondary | ICD-10-CM | POA: Diagnosis not present

## 2023-09-05 DIAGNOSIS — E781 Pure hyperglyceridemia: Secondary | ICD-10-CM | POA: Diagnosis present

## 2023-09-05 DIAGNOSIS — E871 Hypo-osmolality and hyponatremia: Secondary | ICD-10-CM | POA: Diagnosis present

## 2023-09-05 DIAGNOSIS — Z9049 Acquired absence of other specified parts of digestive tract: Secondary | ICD-10-CM | POA: Diagnosis not present

## 2023-09-05 DIAGNOSIS — E861 Hypovolemia: Secondary | ICD-10-CM | POA: Diagnosis present

## 2023-09-05 DIAGNOSIS — E860 Dehydration: Secondary | ICD-10-CM | POA: Diagnosis present

## 2023-09-05 DIAGNOSIS — R7401 Elevation of levels of liver transaminase levels: Secondary | ICD-10-CM | POA: Diagnosis present

## 2023-09-05 DIAGNOSIS — Z880 Allergy status to penicillin: Secondary | ICD-10-CM | POA: Diagnosis not present

## 2023-09-05 DIAGNOSIS — F410 Panic disorder [episodic paroxysmal anxiety] without agoraphobia: Secondary | ICD-10-CM | POA: Diagnosis present

## 2023-09-05 LAB — CBC WITH DIFFERENTIAL/PLATELET
Abs Immature Granulocytes: 0.12 10*3/uL — ABNORMAL HIGH (ref 0.00–0.07)
Basophils Absolute: 0 10*3/uL (ref 0.0–0.1)
Basophils Relative: 0 %
Eosinophils Absolute: 0.1 10*3/uL (ref 0.0–0.5)
Eosinophils Relative: 2 %
HCT: 37.4 % (ref 36.0–46.0)
Hemoglobin: 13.4 g/dL (ref 12.0–15.0)
Immature Granulocytes: 1 %
Lymphocytes Relative: 15 %
Lymphs Abs: 1.4 10*3/uL (ref 0.7–4.0)
MCH: 31.2 pg (ref 26.0–34.0)
MCHC: 35.8 g/dL (ref 30.0–36.0)
MCV: 87.2 fL (ref 80.0–100.0)
Monocytes Absolute: 0.8 10*3/uL (ref 0.1–1.0)
Monocytes Relative: 8 %
Neutro Abs: 7.1 10*3/uL (ref 1.7–7.7)
Neutrophils Relative %: 74 %
Platelets: 180 10*3/uL (ref 150–400)
RBC: 4.29 MIL/uL (ref 3.87–5.11)
RDW: 14.9 % (ref 11.5–15.5)
Smear Review: NORMAL
WBC: 10.2 10*3/uL (ref 4.0–10.5)
nRBC: 0 % (ref 0.0–0.2)

## 2023-09-05 LAB — COMPREHENSIVE METABOLIC PANEL
ALT: 50 U/L — ABNORMAL HIGH (ref 0–44)
AST: 44 U/L — ABNORMAL HIGH (ref 15–41)
Albumin: 3.7 g/dL (ref 3.5–5.0)
Alkaline Phosphatase: 104 U/L (ref 38–126)
Anion gap: 10 (ref 5–15)
BUN: 12 mg/dL (ref 6–20)
CO2: 18 mmol/L — ABNORMAL LOW (ref 22–32)
Calcium: 8.3 mg/dL — ABNORMAL LOW (ref 8.9–10.3)
Chloride: 104 mmol/L (ref 98–111)
Creatinine, Ser: 0.68 mg/dL (ref 0.44–1.00)
GFR, Estimated: >60 mL/min (ref >60)
Glucose, Bld: 208 mg/dL — ABNORMAL HIGH (ref 70–99)
Potassium: 3.8 mmol/L (ref 3.5–5.1)
Sodium: 132 mmol/L — ABNORMAL LOW (ref 135–145)
Total Bilirubin: 0.5 mg/dL (ref <1.2)
Total Protein: 6.8 g/dL (ref 6.5–8.1)

## 2023-09-05 LAB — I-STAT VENOUS BLOOD GAS, ED
Acid-base deficit: 6 mmol/L — ABNORMAL HIGH (ref 0.0–2.0)
Bicarbonate: 19.3 mmol/L — ABNORMAL LOW (ref 20.0–28.0)
Calcium, Ion: 1.16 mmol/L (ref 1.15–1.40)
HCT: 39 % (ref 36.0–46.0)
Hemoglobin: 13.3 g/dL (ref 12.0–15.0)
O2 Saturation: 83 %
Potassium: 4.2 mmol/L (ref 3.5–5.1)
Sodium: 128 mmol/L — ABNORMAL LOW (ref 135–145)
TCO2: 20 mmol/L — ABNORMAL LOW (ref 22–32)
pCO2, Ven: 35.5 mm[Hg] — ABNORMAL LOW (ref 44–60)
pH, Ven: 7.343 (ref 7.25–7.43)
pO2, Ven: 49 mm[Hg] — ABNORMAL HIGH (ref 32–45)

## 2023-09-05 LAB — LIPID PANEL
Cholesterol: 981 mg/dL — ABNORMAL HIGH (ref 0–200)
LDL Cholesterol: UNDETERMINED mg/dL (ref 0–99)
Triglycerides: >5000 mg/dL — ABNORMAL HIGH (ref <150)
VLDL: UNDETERMINED mg/dL (ref 0–40)

## 2023-09-05 LAB — GLUCOSE, CAPILLARY
Glucose-Capillary: 103 mg/dL — ABNORMAL HIGH (ref 70–99)
Glucose-Capillary: 104 mg/dL — ABNORMAL HIGH (ref 70–99)
Glucose-Capillary: 106 mg/dL — ABNORMAL HIGH (ref 70–99)
Glucose-Capillary: 128 mg/dL — ABNORMAL HIGH (ref 70–99)
Glucose-Capillary: 133 mg/dL — ABNORMAL HIGH (ref 70–99)
Glucose-Capillary: 146 mg/dL — ABNORMAL HIGH (ref 70–99)
Glucose-Capillary: 168 mg/dL — ABNORMAL HIGH (ref 70–99)
Glucose-Capillary: 198 mg/dL — ABNORMAL HIGH (ref 70–99)

## 2023-09-05 LAB — URINALYSIS, W/ REFLEX TO CULTURE (INFECTION SUSPECTED)
Bilirubin Urine: NEGATIVE
Glucose, UA: 100 mg/dL — AB
Ketones, ur: NEGATIVE mg/dL
Leukocytes,Ua: NEGATIVE
Nitrite: NEGATIVE
Protein, ur: NEGATIVE mg/dL
Specific Gravity, Urine: >=1.03 (ref 1.005–1.030)
WBC, UA: NONE SEEN WBC/hpf (ref 0–5)
pH: 5.5 (ref 5.0–8.0)

## 2023-09-05 LAB — ETHANOL: Alcohol, Ethyl (B): UNDETERMINED mg/dL (ref <10)

## 2023-09-05 LAB — LDL CHOLESTEROL, DIRECT: Direct LDL: UNDETERMINED mg/dL (ref 0–99)

## 2023-09-05 LAB — HIV ANTIBODY (ROUTINE TESTING W REFLEX): HIV Screen 4th Generation wRfx: NONREACTIVE

## 2023-09-05 LAB — BETA-HYDROXYBUTYRIC ACID: Beta-Hydroxybutyric Acid: <0.05 mmol/L — ABNORMAL LOW (ref 0.05–0.27)

## 2023-09-05 LAB — PREGNANCY, URINE: Preg Test, Ur: NEGATIVE

## 2023-09-05 LAB — HEMOGLOBIN A1C
Hgb A1c MFr Bld: 8.4 % — ABNORMAL HIGH (ref 4.8–5.6)
Mean Plasma Glucose: 194.38 mg/dL

## 2023-09-05 LAB — LIPASE, BLOOD: Lipase: 89 U/L — ABNORMAL HIGH (ref 11–51)

## 2023-09-05 MED ORDER — IOHEXOL 300 MG/ML  SOLN
75.0000 mL | Freq: Once | INTRAMUSCULAR | Status: AC | PRN
  Administered 2023-09-05: 75 mL via INTRAVENOUS

## 2023-09-05 MED ORDER — MORPHINE SULFATE (PF) 4 MG/ML IV SOLN
4.0000 mg | Freq: Once | INTRAVENOUS | Status: AC
  Administered 2023-09-05: 4 mg via INTRAVENOUS
  Filled 2023-09-05: qty 1

## 2023-09-05 MED ORDER — SORBITOL 70 % SOLN
30.0000 mL | Freq: Every day | Status: DC | PRN
Start: 2023-09-05 — End: 2023-09-09

## 2023-09-05 MED ORDER — DEXTROSE IN LACTATED RINGERS 5 % IV SOLN: INTRAVENOUS | Status: DC

## 2023-09-05 MED ORDER — ALBUTEROL SULFATE (2.5 MG/3ML) 0.083% IN NEBU: 2.5000 mg | INHALATION_SOLUTION | RESPIRATORY_TRACT | Status: DC | PRN

## 2023-09-05 MED ORDER — LACTATED RINGERS IV BOLUS
2000.0000 mL | Freq: Once | INTRAVENOUS | Status: AC
  Administered 2023-09-05: 2000 mL via INTRAVENOUS

## 2023-09-05 MED ORDER — SENNA 8.6 MG PO TABS
1.0000 | ORAL_TABLET | Freq: Two times a day (BID) | ORAL | Status: DC
  Administered 2023-09-05 – 2023-09-06 (×3): 8.6 mg via ORAL
  Filled 2023-09-05 (×3): qty 1

## 2023-09-05 MED ORDER — HYDROMORPHONE HCL 1 MG/ML IJ SOLN
0.5000 mg | INTRAMUSCULAR | Status: DC | PRN
  Administered 2023-09-05 – 2023-09-09 (×6): 0.5 mg via INTRAVENOUS
  Filled 2023-09-05 (×6): qty 1

## 2023-09-05 MED ORDER — INSULIN (MYXREDLIN) INFUSION FOR HYPERTRIGLYCERIDEMIA
0.1000 [IU]/kg/h | INTRAVENOUS | Status: DC
  Filled 2023-09-05: qty 100

## 2023-09-05 MED ORDER — ONDANSETRON HCL 4 MG/2ML IJ SOLN: 4.0000 mg | Freq: Four times a day (QID) | INTRAMUSCULAR | Status: DC | PRN

## 2023-09-05 MED ORDER — LACTATED RINGERS IV SOLN: INTRAVENOUS | Status: AC

## 2023-09-05 MED ORDER — SODIUM CHLORIDE 0.9 % IV BOLUS
500.0000 mL | Freq: Once | INTRAVENOUS | Status: AC
  Administered 2023-09-05: 500 mL via INTRAVENOUS

## 2023-09-05 MED ORDER — DEXTROSE 50 % IV SOLN: 0.0000 mL | INTRAVENOUS | Status: DC | PRN

## 2023-09-05 MED ORDER — ACETAMINOPHEN 650 MG RE SUPP: 650.0000 mg | Freq: Four times a day (QID) | RECTAL | Status: DC | PRN

## 2023-09-05 MED ORDER — ENOXAPARIN SODIUM 40 MG/0.4ML IJ SOSY
40.0000 mg | PREFILLED_SYRINGE | Freq: Every day | INTRAMUSCULAR | Status: DC
  Administered 2023-09-05 – 2023-09-09 (×5): 40 mg via SUBCUTANEOUS
  Filled 2023-09-05 (×5): qty 0.4

## 2023-09-05 MED ORDER — DEXTROSE 5 % IV SOLN: INTRAVENOUS | Status: DC

## 2023-09-05 MED ORDER — ONDANSETRON HCL 4 MG/2ML IJ SOLN
4.0000 mg | Freq: Once | INTRAMUSCULAR | Status: AC
  Administered 2023-09-05: 4 mg via INTRAVENOUS
  Filled 2023-09-05: qty 2

## 2023-09-05 MED ORDER — DEXTROSE 5 % IV SOLN: INTRAVENOUS | Status: AC

## 2023-09-05 MED ORDER — ORAL CARE MOUTH RINSE: 15.0000 mL | OROMUCOSAL | Status: DC | PRN

## 2023-09-05 MED ORDER — SODIUM CHLORIDE 0.9 % IV SOLN
12.5000 mg | Freq: Once | INTRAVENOUS | Status: AC
  Administered 2023-09-05: 12.5 mg via INTRAVENOUS
  Filled 2023-09-05: qty 0.5

## 2023-09-05 MED ORDER — HYDROMORPHONE HCL 1 MG/ML IJ SOLN
1.0000 mg | Freq: Once | INTRAMUSCULAR | Status: AC
  Administered 2023-09-05: 1 mg via INTRAVENOUS
  Filled 2023-09-05: qty 1

## 2023-09-05 MED ORDER — POLYETHYLENE GLYCOL 3350 17 G PO PACK: 17.0000 g | PACK | Freq: Every day | ORAL | Status: DC | PRN

## 2023-09-05 MED ORDER — SODIUM CHLORIDE 0.9% FLUSH
3.0000 mL | Freq: Two times a day (BID) | INTRAVENOUS | Status: DC
  Administered 2023-09-05 – 2023-09-09 (×9): 3 mL via INTRAVENOUS

## 2023-09-05 MED ORDER — INSULIN (MYXREDLIN) INFUSION FOR HYPERTRIGLYCERIDEMIA
0.1000 [IU]/kg/h | INTRAVENOUS | Status: DC
  Administered 2023-09-05 – 2023-09-09 (×7): 0.1 [IU]/kg/h via INTRAVENOUS
  Filled 2023-09-05 (×6): qty 100

## 2023-09-05 MED ORDER — MAGNESIUM CITRATE PO SOLN: 1.0000 | Freq: Once | ORAL | Status: DC | PRN

## 2023-09-05 MED ORDER — ONDANSETRON HCL 4 MG PO TABS: 4.0000 mg | ORAL_TABLET | Freq: Four times a day (QID) | ORAL | Status: DC | PRN

## 2023-09-05 MED ORDER — INSULIN REGULAR(HUMAN) IN NACL 100-0.9 UT/100ML-% IV SOLN
INTRAVENOUS | Status: DC
  Filled 2023-09-05: qty 100

## 2023-09-05 MED ORDER — CHLORHEXIDINE GLUCONATE CLOTH 2 % EX PADS
6.0000 | MEDICATED_PAD | Freq: Every day | CUTANEOUS | Status: DC
  Administered 2023-09-05 – 2023-09-08 (×4): 6 via TOPICAL

## 2023-09-05 MED ORDER — PROMETHAZINE HCL 25 MG/ML IJ SOLN
INTRAMUSCULAR | Status: AC
  Administered 2023-09-05: 25 mg
  Filled 2023-09-05: qty 1

## 2023-09-05 MED ORDER — ACETAMINOPHEN 325 MG PO TABS
650.0000 mg | ORAL_TABLET | Freq: Four times a day (QID) | ORAL | Status: DC | PRN
  Administered 2023-09-06 – 2023-09-08 (×5): 650 mg via ORAL
  Filled 2023-09-05 (×6): qty 2

## 2023-09-05 MED ORDER — OXYCODONE HCL 5 MG PO TABS
5.0000 mg | ORAL_TABLET | ORAL | Status: DC | PRN
  Administered 2023-09-05 – 2023-09-09 (×12): 5 mg via ORAL
  Filled 2023-09-05 (×12): qty 1

## 2023-09-05 MED ORDER — LACTATED RINGERS IV SOLN: INTRAVENOUS | Status: DC

## 2023-09-05 MED ORDER — HYDRALAZINE HCL 20 MG/ML IJ SOLN: 10.0000 mg | Freq: Four times a day (QID) | INTRAMUSCULAR | Status: DC | PRN

## 2023-09-05 NOTE — ED Notes (Signed)
RT Note: VBG completed

## 2023-09-05 NOTE — ED Provider Notes (Signed)
Pt signed out by Dr. Jacqulyn Bath pending labs and symptomatic improvement.   Pt has a hx of elevated TG.  She said Lovaza is the only thing that has worked for her.  She has been out for over a month.  She lost her insurance and has been unable to go to her doctor.   UA neg other than tr hgb and glc CBC neg Preg neg  Lip 89  CMP with CO2 low at 18, Glucose elevated at 208  Lipid panel:  Cholesterol elevated at 981 and TG elevated at >5000  VBG with ph 7.34  CT abd/pelvis reviewed by me.  I agree with the radiologist.   . Peripancreatic edema in the pancreatic tail region compatible  with acute pancreatitis. Subtle loss of parenchymal architecture in  the tail the pancreas is presumably related to edema. Given fullness  in the tail of the pancreas, follow-up recommended to ensure  complete resolution.  2. 5.3 cm simple appearing cyst in the right adnexal space.  Follow-up pelvic ultrasound recommended in 3-6 months tensure  resolution. This recommendation follows ACR consensus guidelines:  White Paper of the ACR Incidental Findings Committee II on Adnexal  Findings. J Am Coll Radiol [phone].  3. Hepatic steatosis.    Pt has required several doses of morphine and zofran and is still in pain.  She is d/w Dr. Kirby Crigler (triad) for admission.    Jacalyn Lefevre, MD 09/07/23 [phone]

## 2023-09-05 NOTE — ED Provider Notes (Signed)
Emergency Department Provider Note   I have reviewed the triage vital signs and the nursing notes.   HISTORY  Chief Complaint Abdominal Pain   HPI Katharina Fine is a 47 y.o. female with past history of pancreatitis presents emergency department acute onset epigastric and left upper quadrant abdominal pain.  She had some associated nausea as well.  No pain into the chest or shortness of breath.  Symptoms actually began when she became startled with someone walking around out in front of her house.  She notes a history of panic attacks and states that could be what is going on but also notes the history of pancreatitis.  She had continued pain, even after the person left her yard and so comes in for evaluation.  No fevers.  No diarrhea.  She did have some nausea but no vomiting.   Past Medical History:  Diagnosis Date   Abnormal Pap smear of cervix    Patient had a procedure to cervix- ?LEEP?   History of angina    Pancreatitis     Review of Systems  Constitutional: No fever/chills Cardiovascular: Denies chest pain. Respiratory: Denies shortness of breath. Gastrointestinal: Positive LUQ and epigastric abdominal pain. Positive nausea, no vomiting.  No diarrhea.  No constipation. Genitourinary: Negative for dysuria. Musculoskeletal: Negative for back pain. Skin: Negative for rash. Neurological: Negative for headaches.   ____________________________________________   PHYSICAL EXAM:  VITAL SIGNS: ED Triage Vitals  Encounter Vitals Group     BP 09/05/23 0517 (!) 165/81     Pulse Rate 09/05/23 0517 69     Resp 09/05/23 0517 19     Temp 09/05/23 0517 97.6 F (36.4 C)     Temp Source 09/05/23 0517 Oral     SpO2 09/05/23 0517 99 %     Weight 09/05/23 0528 154 lb (69.9 kg)     Height 09/05/23 0528 5\' 5"  (1.651 m)   Constitutional: Alert and oriented. Well appearing and in no acute distress. Eyes: Conjunctivae are normal.  Head: Atraumatic. Nose: No  congestion/rhinnorhea. Mouth/Throat: Mucous membranes are moist. Neck: No stridor.   Cardiovascular: Normal rate, regular rhythm. Good peripheral circulation. Grossly normal heart sounds.   Respiratory: Normal respiratory effort.  No retractions. Lungs CTAB. Gastrointestinal: Soft with epigastric and LUQ tenderness. No peritonitis. No lower abdominal tenderness. No distention.  Musculoskeletal: No gross deformities of extremities. Neurologic:  Normal speech and language.  Skin:  Skin is warm, dry and intact. No rash noted.   ____________________________________________   LABS (all labs ordered are listed, but only abnormal results are displayed)  Labs Reviewed  COMPREHENSIVE METABOLIC PANEL - Abnormal; Notable for the following components:      Result Value   Sodium 132 (*)    CO2 18 (*)    Glucose, Bld 208 (*)    Calcium 8.3 (*)    AST 44 (*)    ALT 50 (*)    All other components within normal limits  LIPASE, BLOOD - Abnormal; Notable for the following components:   Lipase 89 (*)    All other components within normal limits  CBC WITH DIFFERENTIAL/PLATELET - Abnormal; Notable for the following components:   Abs Immature Granulocytes 0.12 (*)    All other components within normal limits  URINALYSIS, W/ REFLEX TO CULTURE (INFECTION SUSPECTED) - Abnormal; Notable for the following components:   Glucose, UA 100 (*)    Hgb urine dipstick TRACE (*)    Bacteria, UA RARE (*)    All other  components within normal limits  LIPID PANEL - Abnormal; Notable for the following components:   Cholesterol 981 (*)    Triglycerides >5,000 (*)    All other components within normal limits  HEMOGLOBIN A1C - Abnormal; Notable for the following components:   Hgb A1c MFr Bld 8.4 (*)    All other components within normal limits  BETA-HYDROXYBUTYRIC ACID - Abnormal; Notable for the following components:   Beta-Hydroxybutyric Acid <0.05 (*)    All other components within normal limits  GLUCOSE,  CAPILLARY - Abnormal; Notable for the following components:   Glucose-Capillary 198 (*)    All other components within normal limits  TRIGLYCERIDES - Abnormal; Notable for the following components:   Triglycerides >5,000 (*)    All other components within normal limits  COMPREHENSIVE METABOLIC PANEL - Abnormal; Notable for the following components:   Sodium 132 (*)    Potassium 3.2 (*)    CO2 17 (*)    BUN 5 (*)    Calcium 8.6 (*)    Total Protein 6.3 (*)    Albumin 3.3 (*)    Total Bilirubin 1.2 (*)    All other components within normal limits  HEMOGLOBIN A1C - Abnormal; Notable for the following components:   Hgb A1c MFr Bld 8.1 (*)    All other components within normal limits  GLUCOSE, CAPILLARY - Abnormal; Notable for the following components:   Glucose-Capillary 168 (*)    All other components within normal limits  GLUCOSE, CAPILLARY - Abnormal; Notable for the following components:   Glucose-Capillary 146 (*)    All other components within normal limits  GLUCOSE, CAPILLARY - Abnormal; Notable for the following components:   Glucose-Capillary 133 (*)    All other components within normal limits  GLUCOSE, CAPILLARY - Abnormal; Notable for the following components:   Glucose-Capillary 128 (*)    All other components within normal limits  GLUCOSE, CAPILLARY - Abnormal; Notable for the following components:   Glucose-Capillary 104 (*)    All other components within normal limits  GLUCOSE, CAPILLARY - Abnormal; Notable for the following components:   Glucose-Capillary 103 (*)    All other components within normal limits  GLUCOSE, CAPILLARY - Abnormal; Notable for the following components:   Glucose-Capillary 106 (*)    All other components within normal limits  CBC - Abnormal; Notable for the following components:   RBC 3.43 (*)    Hemoglobin 10.6 (*)    HCT 31.1 (*)    nRBC 1.0 (*)    All other components within normal limits  TRIGLYCERIDES - Abnormal; Notable for the  following components:   Triglycerides 1,518 (*)    All other components within normal limits  GLUCOSE, CAPILLARY - Abnormal; Notable for the following components:   Glucose-Capillary 120 (*)    All other components within normal limits  GLUCOSE, CAPILLARY - Abnormal; Notable for the following components:   Glucose-Capillary 133 (*)    All other components within normal limits  GLUCOSE, CAPILLARY - Abnormal; Notable for the following components:   Glucose-Capillary 136 (*)    All other components within normal limits  GLUCOSE, CAPILLARY - Abnormal; Notable for the following components:   Glucose-Capillary 128 (*)    All other components within normal limits  COMPREHENSIVE METABOLIC PANEL - Abnormal; Notable for the following components:   Sodium 131 (*)    Potassium 3.3 (*)    Glucose, Bld 109 (*)    BUN <5 (*)    Calcium 8.6 (*)  Albumin 3.4 (*)    Total Bilirubin 1.9 (*)    All other components within normal limits  TRIGLYCERIDES - Abnormal; Notable for the following components:   Triglycerides 844 (*)    All other components within normal limits  GLUCOSE, CAPILLARY - Abnormal; Notable for the following components:   Glucose-Capillary 108 (*)    All other components within normal limits  GLUCOSE, CAPILLARY - Abnormal; Notable for the following components:   Glucose-Capillary 108 (*)    All other components within normal limits  GLUCOSE, CAPILLARY - Abnormal; Notable for the following components:   Glucose-Capillary 100 (*)    All other components within normal limits  GLUCOSE, CAPILLARY - Abnormal; Notable for the following components:   Glucose-Capillary 166 (*)    All other components within normal limits  I-STAT VENOUS BLOOD GAS, ED - Abnormal; Notable for the following components:   pCO2, Ven 35.5 (*)    pO2, Ven 49 (*)    Bicarbonate 19.3 (*)    TCO2 20 (*)    Acid-base deficit 6.0 (*)    Sodium 128 (*)    All other components within normal limits  MRSA NEXT GEN  BY PCR, NASAL  MRSA NEXT GEN BY PCR, NASAL  PREGNANCY, URINE  LDL CHOLESTEROL, DIRECT  HIV ANTIBODY (ROUTINE TESTING W REFLEX)  MAGNESIUM  PHOSPHORUS  HEPATITIS PANEL, ACUTE  ETHANOL  GLUCOSE, CAPILLARY  GLUCOSE, CAPILLARY  GLUCOSE, CAPILLARY  GLUCOSE, CAPILLARY  GLUCOSE, CAPILLARY  GLUCOSE, CAPILLARY  MAGNESIUM  GLUCOSE, CAPILLARY  GLUCOSE, CAPILLARY  GLUCOSE, CAPILLARY  GLUCOSE, CAPILLARY  GLUCOSE, CAPILLARY  TRIGLYCERIDES   ____________________________________________   PROCEDURES  Procedure(s) performed:   Procedures  None  ____________________________________________   INITIAL IMPRESSION / ASSESSMENT AND PLAN / ED COURSE  Pertinent labs & imaging results that were available during my care of the patient were reviewed by me and considered in my medical decision making (see chart for details).   This patient is Presenting for Evaluation of abdominal pain, which does require a range of treatment options, and is a complaint that involves a high risk of morbidity and mortality.  The Differential Diagnoses includes but is not exclusive to acute cholecystitis, intrathoracic causes for epigastric abdominal pain, gastritis, duodenitis, pancreatitis, small bowel or large bowel obstruction, abdominal aortic aneurysm, hernia, gastritis, etc.   Critical Interventions-    Medications  dextrose 50 % solution 0-50 mL (has no administration in time range)  enoxaparin (LOVENOX) injection 40 mg (40 mg Subcutaneous Given 09/07/23 1007)  sodium chloride flush (NS) 0.9 % injection 3 mL (3 mLs Intravenous Given 09/07/23 1008)  acetaminophen (TYLENOL) tablet 650 mg (650 mg Oral Given 09/07/23 1011)    Or  acetaminophen (TYLENOL) suppository 650 mg ( Rectal See Alternative 09/07/23 1011)  oxyCODONE (Oxy IR/ROXICODONE) immediate release tablet 5 mg (5 mg Oral Given 09/07/23 1242)  polyethylene glycol (MIRALAX / GLYCOLAX) packet 17 g (has no administration in time range)  sorbitol  70 % solution 30 mL (has no administration in time range)  magnesium citrate solution 1 Bottle (has no administration in time range)  ondansetron (ZOFRAN) tablet 4 mg (has no administration in time range)    Or  ondansetron (ZOFRAN) injection 4 mg (has no administration in time range)  albuterol (PROVENTIL) (2.5 MG/3ML) 0.083% nebulizer solution 2.5 mg (has no administration in time range)  hydrALAZINE (APRESOLINE) injection 10 mg (has no administration in time range)  HYDROmorphone (DILAUDID) injection 0.5 mg (0.5 mg Intravenous Given 09/06/23 1949)  insulin (MYXREDLIN)  100 units/100 mL infusion for hypertriglyceridemia-induced pancreatitis (0.1 Units/kg/hr  69.9 kg Intravenous Infusion Verify 09/07/23 1033)  dextrose 5 % solution (0 mLs Intravenous Stopped 09/06/23 1801)  lactated ringers infusion (0 mLs Intravenous Stopped 09/06/23 1801)  Oral care mouth rinse (has no administration in time range)  Chlorhexidine Gluconate Cloth 2 % PADS 6 each (6 each Topical Given 09/07/23 1008)  sodium bicarbonate tablet 650 mg (650 mg Oral Given 09/07/23 1007)  lactated ringers infusion ( Intravenous New Bag/Given 09/07/23 1242)  dextrose 5 % solution ( Intravenous Infusion Verify 09/07/23 1033)  simethicone (MYLICON) chewable tablet 80 mg (80 mg Oral Given 09/07/23 1425)  polyethylene glycol (MIRALAX / GLYCOLAX) packet 17 g (17 g Oral Given 09/07/23 1008)  senna-docusate (Senokot-S) tablet 1 tablet (1 tablet Oral Given 09/07/23 1007)  omega-3 acid ethyl esters (LOVAZA) capsule 1 g (1 g Oral Given 09/07/23 1040)  fenofibrate tablet 160 mg (160 mg Oral Given 09/07/23 1040)  atorvastatin (LIPITOR) tablet 80 mg (80 mg Oral Given 09/07/23 1040)  sodium chloride 0.9 % bolus 500 mL (0 mLs Intravenous Stopped 09/05/23 0743)  ondansetron (ZOFRAN) injection 4 mg (4 mg Intravenous Given 09/05/23 0605)  morphine (PF) 4 MG/ML injection 4 mg (4 mg Intravenous Given 09/05/23 0607)  morphine (PF) 4 MG/ML injection 4  mg (4 mg Intravenous Given 09/05/23 0715)  morphine (PF) 4 MG/ML injection 4 mg (4 mg Intravenous Given 09/05/23 0813)  morphine (PF) 4 MG/ML injection 4 mg (4 mg Intravenous Given 09/05/23 0956)  ondansetron (ZOFRAN) injection 4 mg (4 mg Intravenous Given 09/05/23 1006)  iohexol (OMNIPAQUE) 300 MG/ML solution 75 mL (75 mLs Intravenous Contrast Given 09/05/23 1019)  promethazine (PHENERGAN) 12.5 mg in sodium chloride 0.9 % 50 mL IVPB (0 mg Intravenous Stopped 09/05/23 1129)  promethazine (PHENERGAN) 25 MG/ML injection (25 mg  Given 09/05/23 1107)  HYDROmorphone (DILAUDID) injection 1 mg (1 mg Intravenous Given 09/05/23 1203)  lactated ringers bolus 2,000 mL (0 mLs Intravenous Stopped 09/05/23 1823)  potassium chloride SA (KLOR-CON M) CR tablet 40 mEq (40 mEq Oral Given 09/06/23 1156)  potassium chloride (KLOR-CON M) CR tablet 40 mEq (40 mEq Oral Given 09/07/23 1242)    Reassessment after intervention:  symptoms improved.    Clinical Laboratory Tests Ordered, included CBC without leukocytosis. LFTs and lipase pending due to lipemia.   Radiologic Tests Ordered, included CT but result is pending.   Cardiac Monitor Tracing which shows NSR.    Social Determinants of Health Risk patient is a non-smoker. Denies EtOH or drug use.   Medical Decision Making: Summary:  Patient presents emergency department epigastric and left upper quadrant abdominal pain along with nausea.  Tenderness on exam but no peritonitis.  Defer abdominal imaging for now, although considered.  Plan to follow labs and reassess after fluids and pain medication.  Reevaluation with update and discussion with patient. Pain improved. Care transferred to Dr. Particia Nearing.    Patient's presentation is most consistent with acute presentation with potential threat to life or bodily function.   Disposition: pending  ____________________________________________  FINAL CLINICAL IMPRESSION(S) / ED DIAGNOSES  Final diagnoses:  Other  acute pancreatitis without infection or necrosis  Hyperglycemia  Hypertriglyceridemia  Hypercholesterolemia    Note:  This document was prepared using Dragon voice recognition software and may include unintentional dictation errors.  Alona Bene, MD, [facility]    Dalessandro Baldyga, Arlyss Repress, MD 09/07/23 [phone]

## 2023-09-05 NOTE — ED Notes (Signed)
ED TO INPATIENT HANDOFF REPORT  ED Nurse Name and Phone #: [phone]  S Name/Age/Gender Robyn Rivera 47 y.o. female Room/Bed: MH04/MH04  Code Status   Code Status: Not on file  Home/SNF/Other Home Patient oriented to: self, place, time, and situation Is this baseline? Yes   Triage Complete: Triage complete  Chief Complaint Acute pancreatitis [K85.90]  Triage Note upper center abdominal pain since 11:00 last night. Can be felt in left side and back as well. Pain comes and goes. Denies injury. States had an incident at home that go her really scared and pain started after. 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 Also had seafood last night.    Allergies Allergies  Allergen Reactions   Penicillins Other (See Comments)    Patient states "when I was little I almost died"    Level of Care/Admitting Diagnosis ED Disposition     ED Disposition  Admit   Condition  --   Comment  Hospital Area: [facility] [[id number]]  Level of Care: Progressive [102]  Admit to Progressive based on following criteria: GI, ENDOCRINE disease patients with GI bleeding, acute liver failure or pancreatitis, stable with diabetic ketoacidosis or thyrotoxicosis (hypothyroid) state.  May admit patient to [facility] or [facility] if equivalent level of care is available:: Yes  Interfacility transfer: Yes  Covid Evaluation: Asymptomatic - no recent exposure (last 10 days) testing not required  Diagnosis: Acute pancreatitis [577.0.ICD-9-CM]  Admitting Physician: Maryln Gottron [[id number]]  Attending Physician: Kirby Crigler, MIR [email or web address] [[id number]]  Certification:: I certify this patient will need inpatient services for at least 2 midnights  Expected Medical Readiness: 09/08/2023          B Medical/Surgery History Past Medical History:  Diagnosis Date   Abnormal Pap smear of cervix    Patient had a procedure to cervix- ?LEEP?   History of angina    Pancreatitis    Past  Surgical History:  Procedure Laterality Date   CHOLECYSTECTOMY     HERNIA REPAIR     emergency repair surgery following ovarian cystectomy   INCONTINENCE SURGERY     Stress Incontinence surgery per patient   ovarian cystectomy Left 2016     A IV Location/Drains/Wounds Patient Lines/Drains/Airways Status     Active Line/Drains/Airways     Name Placement date Placement time Site Days   Peripheral IV 09/05/23 20 G Right Antecubital 09/05/23  0602  Antecubital  less than 1            Intake/Output Last 24 hours  Intake/Output Summary (Last 24 hours) at 09/05/2023 1121 Last data filed at 09/05/2023 0743 Gross per 24 hour  Intake 500.11 ml  Output --  Net 500.11 ml    Labs/Imaging Results for orders placed or performed during the hospital encounter of 09/05/23 (from the past 48 hour(s))  Comprehensive metabolic panel     Status: Abnormal   Collection Time: 09/05/23  6:09 AM  Result Value Ref Range   Sodium 132 (L) 135 - 145 mmol/L    Comment: POST-ULTRACENTRIFUGATION   Potassium 3.8 3.5 - 5.1 mmol/L    Comment: POST-ULTRACENTRIFUGATION   Chloride 104 98 - 111 mmol/L    Comment: POST-ULTRACENTRIFUGATION   CO2 18 (L) 22 - 32 mmol/L    Comment: POST-ULTRACENTRIFUGATION   Glucose, Bld 208 (H) 70 - 99 mg/dL    Comment: Glucose reference range applies only to samples taken after fasting for at least 8 hours. POST-ULTRACENTRIFUGATION  BUN 12 6 - 20 mg/dL    Comment: POST-ULTRACENTRIFUGATION   Creatinine, Ser 0.68 0.44 - 1.00 mg/dL    Comment: POST-ULTRACENTRIFUGATION   Calcium 8.3 (L) 8.9 - 10.3 mg/dL    Comment: POST-ULTRACENTRIFUGATION   Total Protein 6.8 6.5 - 8.1 g/dL    Comment: POST-ULTRACENTRIFUGATION   Albumin 3.7 3.5 - 5.0 g/dL    Comment: POST-ULTRACENTRIFUGATION   AST 44 (H) 15 - 41 U/L    Comment: POST-ULTRACENTRIFUGATION   ALT 50 (H) 0 - 44 U/L    Comment: POST-ULTRACENTRIFUGATION   Alkaline Phosphatase 104 38 - 126 U/L    Comment:  POST-ULTRACENTRIFUGATION   Total Bilirubin 0.5 <1.2 mg/dL    Comment: POST-ULTRACENTRIFUGATION   GFR, Estimated >60 >60 mL/min    Comment: (NOTE) Calculated using the CKD-EPI Creatinine Equation (2021)    Anion gap 10 5 - 15    Comment: Performed at [facility], [address], [place], [place] [address]  Lipase, blood     Status: Abnormal   Collection Time: 09/05/23  6:09 AM  Result Value Ref Range   Lipase 89 (H) 11 - 51 U/L    Comment: POST-ULTRACENTRIFUGATION Performed at [facility], [address], [place], [place] [address]   CBC with Differential     Status: Abnormal   Collection Time: 09/05/23  6:09 AM  Result Value Ref Range   WBC 10.2 4.0 - 10.5 K/uL   RBC 4.29 3.87 - 5.11 MIL/uL   Hemoglobin 13.4 12.0 - 15.0 g/dL   HCT 30.8 65.7 - 84.6 %   MCV 87.2 80.0 - 100.0 fL   MCH 31.2 26.0 - 34.0 pg   MCHC 35.8 30.0 - 36.0 g/dL    Comment: CORRECTED FOR LIPEMIA   RDW 14.9 11.5 - 15.5 %   Platelets 180 150 - 400 K/uL   nRBC 0.0 0.0 - 0.2 %   Neutrophils Relative % 74 %   Neutro Abs 7.1 1.7 - 7.7 K/uL   Lymphocytes Relative 15 %   Lymphs Abs 1.4 0.7 - 4.0 K/uL   Monocytes Relative 8 %   Monocytes Absolute 0.8 0.1 - 1.0 K/uL   Eosinophils Relative 2 %   Eosinophils Absolute 0.1 0.0 - 0.5 K/uL   Basophils Relative 0 %   Basophils Absolute 0.0 0.0 - 0.1 K/uL   WBC Morphology MORPHOLOGY UNREMARKABLE    RBC Morphology MORPHOLOGY UNREMARKABLE    Smear Review Normal platelet morphology    Immature Granulocytes 1 %   Abs Immature Granulocytes 0.12 (H) 0.00 - 0.07 K/uL    Comment: Performed at [facility], [address] [place], [place] [address]  Urinalysis, w/ Reflex to Culture (Infection Suspected) -Urine, Clean Catch     Status: Abnormal   Collection Time: 09/05/23  6:56 AM  Result Value Ref Range   Specimen Source URINE, CLEAN CATCH    Color, Urine YELLOW YELLOW   APPearance CLEAR CLEAR   Specific Gravity, Urine >=1.030 1.005 - 1.030   pH  5.5 5.0 - 8.0   Glucose, UA 100 (A) NEGATIVE mg/dL   Hgb urine dipstick TRACE (A) NEGATIVE   Bilirubin Urine NEGATIVE NEGATIVE   Ketones, ur NEGATIVE NEGATIVE mg/dL   Protein, ur NEGATIVE NEGATIVE mg/dL   Nitrite NEGATIVE NEGATIVE   Leukocytes,Ua NEGATIVE NEGATIVE   Squamous Epithelial / HPF 0-5 0 - 5 /HPF   WBC, UA NONE SEEN 0 - 5 WBC/hpf    Comment: Reflex urine culture not  performed if WBC <=10, OR if Squamous epithelial cells >5. If Squamous epithelial cells >5, suggest recollection.   RBC / HPF 0-5 0 - 5 RBC/hpf   Bacteria, UA RARE (A) NONE SEEN    Comment: Performed at [facility], [address] [place], [place] [address]  Pregnancy, urine     Status: None   Collection Time: 09/05/23  6:56 AM  Result Value Ref Range   Preg Test, Ur NEGATIVE NEGATIVE    Comment:        THE SENSITIVITY OF THIS METHODOLOGY IS >25 mIU/mL. Performed at [facility], [address] [place], [place] [address]   Lipid panel     Status: Abnormal   Collection Time: 09/05/23  7:30 AM  Result Value Ref Range   Cholesterol 981 (H) 0 - 200 mg/dL   Triglycerides >0,981 (H) <150 mg/dL    Comment: RESULT CONFIRMED BY MANUAL DILUTION   HDL NOT REPORTED DUE TO HIGH TRIGLYCERIDES >40 mg/dL   Total CHOL/HDL Ratio NOT REPORTED DUE TO HIGH TRIGLYCERIDES RATIO   VLDL UNABLE TO CALCULATE IF TRIGLYCERIDE OVER 400 mg/dL 0 - 40 mg/dL   LDL Cholesterol UNABLE TO CALCULATE IF TRIGLYCERIDE OVER 400 mg/dL 0 - 99 mg/dL    Comment: Performed at [facility], [address] [place], [place] [address]  LDL cholesterol, direct     Status: None   Collection Time: 09/05/23  7:30 AM  Result Value Ref Range   Direct LDL UNABLE TO CALCULATE IF TRIGLYCERIDE IS >1293 mg/dL 0 - 99 mg/dL    Comment: Performed at [facility], [address] [place], [place] [address]  I-Stat venous blood gas, [facility], [facility], [facility])     Status: Abnormal   Collection Time: 09/05/23 11:12 AM  Result Value Ref Range    pH, Ven 7.343 7.25 - 7.43   pCO2, Ven 35.5 (L) 44 - 60 mmHg   pO2, Ven 49 (H) 32 - 45 mmHg   Bicarbonate 19.3 (L) 20.0 - 28.0 mmol/L   TCO2 20 (L) 22 - 32 mmol/L   O2 Saturation 83 %   Acid-base deficit 6.0 (H) 0.0 - 2.0 mmol/L   Sodium 128 (L) 135 - 145 mmol/L   Potassium 4.2 3.5 - 5.1 mmol/L   Calcium, Ion 1.16 1.15 - 1.40 mmol/L   HCT 39.0 36.0 - 46.0 %   Hemoglobin 13.3 12.0 - 15.0 g/dL   Collection site IV start    Drawn by HIDE    Sample type VENOUS    CT ABDOMEN PELVIS W CONTRAST  Result Date: 09/05/2023 CLINICAL DATA:  Abdominal pain. Left-sided and radiates to the back. Pain comes and goes. EXAM: CT ABDOMEN AND PELVIS WITH CONTRAST TECHNIQUE: Multidetector CT imaging of the abdomen and pelvis was performed using the standard protocol following bolus administration of intravenous contrast. RADIATION DOSE REDUCTION: This exam was performed according to the departmental dose-optimization program which includes automated exposure control, adjustment of the mA and/or kV according to patient size and/or use of iterative reconstruction technique. CONTRAST:  75mL OMNIPAQUE IOHEXOL 300 MG/ML  SOLN COMPARISON:  07/13/2017 FINDINGS: Lower chest: Dependent atelectasis noted in the lung bases. Hepatobiliary: The liver shows diffusely decreased attenuation suggesting fat deposition. No suspicious focal abnormality within the liver parenchyma. Gallbladder is surgically absent. Small cystic focus in the cholecystectomy bed is stable, potentially dilated cystic duct remnant. No intrahepatic or extrahepatic biliary dilation. Pancreas: Peripancreatic edema is seen in the  pancreatic tail region. No ductal dilatation or underlying mass lesion evident. Subtle loss of parenchymal architecture in the tail the pancreas (27/301) is presumably related to edema. Spleen: No splenomegaly. No suspicious focal mass lesion. Adrenals/Urinary Tract: No adrenal nodule or mass. Kidneys unremarkable. No evidence for  hydroureter. The urinary bladder appears normal for the degree of distention. Stomach/Bowel: Stomach is unremarkable. No gastric wall thickening. No evidence of outlet obstruction. Duodenum is normally positioned as is the ligament of Treitz. No small bowel wall thickening. No small bowel dilatation. The terminal ileum is normal. The appendix is normal. No gross colonic mass. No colonic wall thickening. Vascular/Lymphatic: No abdominal aortic aneurysm. No abdominal aortic atherosclerotic calcification. There is no gastrohepatic or hepatoduodenal ligament lymphadenopathy. No retroperitoneal or mesenteric lymphadenopathy. No pelvic sidewall lymphadenopathy. Reproductive: The uterus is unremarkable. 5.3 cm simple appearing cyst identified right adnexal space. No left adnexal mass. Other: No intraperitoneal free fluid. Musculoskeletal: No worrisome lytic or sclerotic osseous abnormality. IMPRESSION: 1. Peripancreatic edema in the pancreatic tail region compatible with acute pancreatitis. Subtle loss of parenchymal architecture in the tail the pancreas is presumably related to edema. Given fullness in the tail of the pancreas, follow-up recommended to ensure complete resolution. 2. 5.3 cm simple appearing cyst in the right adnexal space. Follow-up pelvic ultrasound recommended in 3-6 months tensure resolution. This recommendation follows ACR consensus guidelines: White Paper of the ACR Incidental Findings Committee II on Adnexal Findings. J Am Coll Radiol [phone]. 3. Hepatic steatosis. Electronically Signed   By: Kennith Center M.D.   On: 09/05/2023 10:42    Pending Labs Unresulted Labs (From admission, onward)     Start     Ordered   09/05/23 1051  Beta-hydroxybutyric acid  Once,   URGENT        09/05/23 1050   09/05/23 1050  Hemoglobin A1c  Once,   URGENT        09/05/23 1049            Vitals/Pain Today's Vitals   09/05/23 0900 09/05/23 0956 09/05/23 1006 09/05/23 1006  BP: 126/78      Pulse: 72     Resp: 20     Temp:   98.5 F (36.9 C)   TempSrc:   Oral   SpO2: 96%     Weight:      Height:      PainSc:  9   7     Isolation Precautions No active isolations  Medications Medications  promethazine (PHENERGAN) 12.5 mg in sodium chloride 0.9 % 50 mL IVPB (12.5 mg Intravenous New Bag/Given 09/05/23 1119)  sodium chloride 0.9 % bolus 500 mL (0 mLs Intravenous Stopped 09/05/23 0743)  ondansetron (ZOFRAN) injection 4 mg (4 mg Intravenous Given 09/05/23 0605)  morphine (PF) 4 MG/ML injection 4 mg (4 mg Intravenous Given 09/05/23 0607)  morphine (PF) 4 MG/ML injection 4 mg (4 mg Intravenous Given 09/05/23 0715)  morphine (PF) 4 MG/ML injection 4 mg (4 mg Intravenous Given 09/05/23 0813)  morphine (PF) 4 MG/ML injection 4 mg (4 mg Intravenous Given 09/05/23 0956)  ondansetron (ZOFRAN) injection 4 mg (4 mg Intravenous Given 09/05/23 1006)  iohexol (OMNIPAQUE) 300 MG/ML solution 75 mL (75 mLs Intravenous Contrast Given 09/05/23 1019)  promethazine (PHENERGAN) 25 MG/ML injection (25 mg  Given 09/05/23 1107)    Mobility walks     Focused Assessments   R Recommendations: See Admitting Provider Note  Report given to:   Additional Notes:

## 2023-09-05 NOTE — ED Triage Notes (Signed)
upper center abdominal pain since 11:00 last night. Can be felt in left side and back as well. Pain comes and goes. Denies injury. States had an incident at home that go her really scared and pain started after. 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 Also had seafood last night.

## 2023-09-05 NOTE — H&P (Addendum)
History and Physical    Robyn Rivera BJY:[id number] DOB: 09/30/1976 DOA: 09/05/2023  PCP: Patient, No Pcp Per  Patient coming from: Home  I have personally briefly reviewed patient's old medical records in [facility]  Chief Complaint: Abdominal pain  HPI: Robyn Rivera is a 47 y.o. female with medical history significant of hypertriglyceridemia induced pancreatitis, hyperlipidemia, who presents to the ED with sudden onset epigastric and left upper quadrant abdominal pain with associated nausea and emesis.  Patient denies any fevers, no chest pain, no shortness of breath, no diarrhea, no constipation, no melena, no hematemesis, no hematochezia, no syncope, no lightheadedness, no dizziness.  Patient does endorse some chills, nausea and vomiting.  Patient denies any ongoing daily alcohol use however does endorse some wine occasionally.  Patient does state run out of her Lovaza and has not had any in over a year and a half.  ED Course: Patient seen in the ED, comprehensive metabolic profile with sodium of 132, bicarb of 18, glucose of 284 calcium of 8.3, AST of 44, ALT of 50.  Lipase level noted 89.  Lipid panel with a cholesterol of 981, triglycerides >5000.  CBC unremarkable.  Beta-hydroxybutyrate acid <0.05.  Initial glucose noted at 208.  Urinalysis with 100 of glucose, trace hemoglobin, nitrite negative, leukocytes negative rare bacteria.  0-5 WBCs.  Urine pregnancy test negative.  Review of Systems: As per HPI otherwise all other systems reviewed and are negative.  Past Medical History:  Diagnosis Date   Abnormal Pap smear of cervix    Patient had a procedure to cervix- ?LEEP?   History of angina    Pancreatitis     Past Surgical History:  Procedure Laterality Date   CHOLECYSTECTOMY     HERNIA REPAIR     emergency repair surgery following ovarian cystectomy   INCONTINENCE SURGERY     Stress Incontinence surgery per patient   ovarian cystectomy Left 2016    Social  History  reports that she has never smoked. She has never used smokeless tobacco. She reports that she does not drink alcohol and does not use drugs.  Allergies  Allergen Reactions   Penicillins Other (See Comments)    Patient states "when I was little I almost died"    History reviewed. No pertinent family history. Mother alive age 81 with a history of diabetes and bone pain per patient.  Father alive age 31 healthy with ongoing tobacco use.  Prior to Admission medications   Medication Sig Start Date End Date Taking? Authorizing Provider  acetaminophen (TYLENOL) 500 MG tablet Take 1,000 mg by mouth as needed for mild pain (pain score 1-3) or moderate pain (pain score 4-6).   Yes [provider]  aspirin EC 81 MG tablet Take 81 mg by mouth as needed for mild pain (pain score 1-3) or moderate pain (pain score 4-6) (chest pain). Swallow whole.   Yes [provider]  ibuprofen (ADVIL) 200 MG tablet Take 800 mg by mouth as needed for mild pain (pain score 1-3) or moderate pain (pain score 4-6).   Yes [provider]    Physical Exam: Vitals:   09/05/23 1100 09/05/23 1120 09/05/23 1200 09/05/23 1602  BP: 122/79  133/68 127/63  Pulse: 70 72 65 81  Resp: 16 14 13 15   Temp:  98.1 F (36.7 C)    TempSrc:      SpO2: 95% 94% 97% 98%  Weight:      Height:  Constitutional: NAD, calm, comfortable Vitals:   09/05/23 1100 09/05/23 1120 09/05/23 1200 09/05/23 1602  BP: 122/79  133/68 127/63  Pulse: 70 72 65 81  Resp: 16 14 13 15   Temp:  98.1 F (36.7 C)    TempSrc:      SpO2: 95% 94% 97% 98%  Weight:      Height:       Eyes: PERRL, lids and conjunctivae normal ENMT: Mucous membranes are dry. Posterior pharynx clear of any exudate or lesions.Normal dentition.  Neck: normal, supple, no masses, no thyromegaly Respiratory: clear to auscultation bilaterally, no wheezing, no crackles. Normal respiratory effort. No accessory muscle use.  Cardiovascular:  Regular rate and rhythm, no murmurs / rubs / gallops. No extremity edema. 2+ pedal pulses. No carotid bruits.  Abdomen: Soft, nondistended, tender to palpation in the epigastrium and right left upper quadrant.  Positive bowel sounds.  No rebound.  No guarding.  Musculoskeletal: no clubbing / cyanosis. No joint deformity upper and lower extremities. Good ROM, no contractures. Normal muscle tone.  Skin: no rashes, lesions, ulcers. No induration Neurologic: CN 2-12 grossly intact. Sensation intact, DTR normal. Strength 5/5 in all 4.  Psychiatric: Normal judgment and insight. Alert and oriented x 3. Normal mood.   Labs on Admission: I have personally reviewed following labs and imaging studies  CBC: Recent Labs  Lab 09/05/23 0609 09/05/23 1112  WBC 10.2  --   NEUTROABS 7.1  --   HGB 13.4 13.3  HCT 37.4 39.0  MCV 87.2  --   PLT 180  --     Basic Metabolic Panel: Recent Labs  Lab 09/05/23 0609 09/05/23 1112  NA 132* 128*  K 3.8 4.2  CL 104  --   CO2 18*  --   GLUCOSE 208*  --   BUN 12  --   CREATININE 0.68  --   CALCIUM 8.3*  --     GFR: Estimated Creatinine Clearance: 87.2 mL/min (by C-G formula based on SCr of 0.68 mg/dL).  Liver Function Tests: Recent Labs  Lab 09/05/23 0609  AST 44*  ALT 50*  ALKPHOS 104  BILITOT 0.5  PROT 6.8  ALBUMIN 3.7    Urine analysis:    Component Value Date/Time   COLORURINE YELLOW 09/05/2023 0656   APPEARANCEUR CLEAR 09/05/2023 0656   LABSPEC >=1.030 09/05/2023 0656   PHURINE 5.5 09/05/2023 0656   GLUCOSEU 100 (A) 09/05/2023 0656   HGBUR TRACE (A) 09/05/2023 0656   BILIRUBINUR NEGATIVE 09/05/2023 0656   KETONESUR NEGATIVE 09/05/2023 0656   PROTEINUR NEGATIVE 09/05/2023 0656   NITRITE NEGATIVE 09/05/2023 0656   LEUKOCYTESUR NEGATIVE 09/05/2023 0656    Radiological Exams on Admission: CT ABDOMEN PELVIS W CONTRAST  Result Date: 09/05/2023 CLINICAL DATA:  Abdominal pain. Left-sided and radiates to the back. Pain comes and  goes. EXAM: CT ABDOMEN AND PELVIS WITH CONTRAST TECHNIQUE: Multidetector CT imaging of the abdomen and pelvis was performed using the standard protocol following bolus administration of intravenous contrast. RADIATION DOSE REDUCTION: This exam was performed according to the departmental dose-optimization program which includes automated exposure control, adjustment of the mA and/or kV according to patient size and/or use of iterative reconstruction technique. CONTRAST:  75mL OMNIPAQUE IOHEXOL 300 MG/ML  SOLN COMPARISON:  07/13/2017 FINDINGS: Lower chest: Dependent atelectasis noted in the lung bases. Hepatobiliary: The liver shows diffusely decreased attenuation suggesting fat deposition. No suspicious focal abnormality within the liver parenchyma. Gallbladder is surgically absent. Small cystic focus in the cholecystectomy bed is stable,  potentially dilated cystic duct remnant. No intrahepatic or extrahepatic biliary dilation. Pancreas: Peripancreatic edema is seen in the pancreatic tail region. No ductal dilatation or underlying mass lesion evident. Subtle loss of parenchymal architecture in the tail the pancreas (27/301) is presumably related to edema. Spleen: No splenomegaly. No suspicious focal mass lesion. Adrenals/Urinary Tract: No adrenal nodule or mass. Kidneys unremarkable. No evidence for hydroureter. The urinary bladder appears normal for the degree of distention. Stomach/Bowel: Stomach is unremarkable. No gastric wall thickening. No evidence of outlet obstruction. Duodenum is normally positioned as is the ligament of Treitz. No small bowel wall thickening. No small bowel dilatation. The terminal ileum is normal. The appendix is normal. No gross colonic mass. No colonic wall thickening. Vascular/Lymphatic: No abdominal aortic aneurysm. No abdominal aortic atherosclerotic calcification. There is no gastrohepatic or hepatoduodenal ligament lymphadenopathy. No retroperitoneal or mesenteric lymphadenopathy.  No pelvic sidewall lymphadenopathy. Reproductive: The uterus is unremarkable. 5.3 cm simple appearing cyst identified right adnexal space. No left adnexal mass. Other: No intraperitoneal free fluid. Musculoskeletal: No worrisome lytic or sclerotic osseous abnormality. IMPRESSION: 1. Peripancreatic edema in the pancreatic tail region compatible with acute pancreatitis. Subtle loss of parenchymal architecture in the tail the pancreas is presumably related to edema. Given fullness in the tail of the pancreas, follow-up recommended to ensure complete resolution. 2. 5.3 cm simple appearing cyst in the right adnexal space. Follow-up pelvic ultrasound recommended in 3-6 months tensure resolution. This recommendation follows ACR consensus guidelines: White Paper of the ACR Incidental Findings Committee II on Adnexal Findings. J Am Coll Radiol [phone]. 3. Hepatic steatosis. Electronically Signed   By: Kennith Center M.D.   On: 09/05/2023 10:42    EKG: Not done.  Assessment/Plan Principal Problem:   Acute pancreatitis Active Problems:   Hypertriglyceridemia   Dehydration   Hyponatremia   Transaminitis   #1 hypertriglyceridemia induced acute pancreatitis -Patient admitted with sudden onset epigastric and left upper quadrant abdominal pain with associated nausea and vomiting concerning for acute pancreatitis. -Lipase levels elevated at 89 and patient with a transaminitis with AST of 44, ALT of 53. -Patient states was on Lovaza however has run out for over a year and a half. -Patient denies any ongoing alcohol use. -Lipid panel with a cholesterol of 981, triglycerides >5000. -Check triglyceride levels every 12 hours. -Check alcohol level. -Place on insulin drip at 0.1 units/kg/h, D5W at 40 cc an hour, LR 125 cc an hour. -Check every hour CBGs. -Clear liquid diet. -Once triglyceride levels < 1000 will place on statin, Tricor, Lovaza. -Once triglyceride level < 500 we will discontinue insulin  drip. -Supportive care.  2.  Hyperlipidemia/hypertriglyceridemia -Total cholesterol noted on lipid panel at 981, triglyceride level >5000. -See #1 -Once triglyceride levels < 1000 will start on statin, Tricor, Lovaza.  3.  Dehydration -IV fluids  4.  Transaminitis -Likely secondary to problem #1. -Check an acute hepatitis panel. -Check a right upper quadrant ultrasound. -Repeat labs in AM.  5.  Hyponatremia -Likely secondary to hypovolemic hyponatremia secondary to GI losses from nausea and vomiting. -Placed on IV fluids. -If no improvement with hyponatremia we will check a urine and serum osmolality, TSH and cortisol level. -Supportive care.  DVT prophylaxis: Lovenox Code Status:   Full Family Communication:  Updated patient.  No family at bedside. Disposition Plan:   Patient is from:  Home  Anticipated DC to:  Home  Anticipated DC date:  3 to 4 days.  Anticipated DC barriers: Clinical improvement. Consults called:  None Admission  status:  Admit to inpatient/stepdown unit.  Severity of Illness: The appropriate patient status for this patient is INPATIENT. Inpatient status is judged to be reasonable and necessary in order to provide the required intensity of service to ensure the patient's safety. The patient's presenting symptoms, physical exam findings, and initial radiographic and laboratory data in the context of their chronic comorbidities is felt to place them at high risk for further clinical deterioration. Furthermore, it is not anticipated that the patient will be medically stable for discharge from the hospital within 2 midnights of admission.   * I certify that at the point of admission it is my clinical judgment that the patient will require inpatient hospital care spanning beyond 2 midnights from the point of admission due to high intensity of service, high risk for further deterioration and high frequency of surveillance required.*    Ramiro Harvest MD [facility]  How to contact the [facility] Attending or Consulting provider 7A - 7P or covering provider during after hours 7P -7A, for this patient?   Check the care team in [facility] and look for a) attending/consulting [facility] provider listed and b) the [facility] team listed Log into [email or web address] and use [facility] universal password to access. If you do not have the password, please contact the hospital operator. Locate the [facility] provider you are looking for under [facility] and page to a number that you can be directly reached. If you still have difficulty reaching the provider, please page the [facility] (Director on Call) for the Hospitalists listed on amion for assistance.  09/05/2023, 4:13 PM

## 2023-09-05 NOTE — ED Notes (Signed)
Lab notified of add-on orders.

## 2023-09-05 NOTE — ED Notes (Signed)
Patient transported to CT 

## 2023-09-06 DIAGNOSIS — E119 Type 2 diabetes mellitus without complications: Secondary | ICD-10-CM

## 2023-09-06 DIAGNOSIS — E1165 Type 2 diabetes mellitus with hyperglycemia: Secondary | ICD-10-CM

## 2023-09-06 DIAGNOSIS — E7800 Pure hypercholesterolemia, unspecified: Secondary | ICD-10-CM

## 2023-09-06 LAB — COMPREHENSIVE METABOLIC PANEL
ALT: 40 U/L (ref 0–44)
AST: 30 U/L (ref 15–41)
Albumin: 3.3 g/dL — ABNORMAL LOW (ref 3.5–5.0)
Alkaline Phosphatase: 79 U/L (ref 38–126)
Anion gap: 12 (ref 5–15)
BUN: 5 mg/dL — ABNORMAL LOW (ref 6–20)
CO2: 17 mmol/L — ABNORMAL LOW (ref 22–32)
Calcium: 8.6 mg/dL — ABNORMAL LOW (ref 8.9–10.3)
Chloride: 103 mmol/L (ref 98–111)
Creatinine, Ser: 0.78 mg/dL (ref 0.44–1.00)
GFR, Estimated: >60 mL/min (ref >60)
Glucose, Bld: 98 mg/dL (ref 70–99)
Potassium: 3.2 mmol/L — ABNORMAL LOW (ref 3.5–5.1)
Sodium: 132 mmol/L — ABNORMAL LOW (ref 135–145)
Total Bilirubin: 1.2 mg/dL — ABNORMAL HIGH (ref <1.2)
Total Protein: 6.3 g/dL — ABNORMAL LOW (ref 6.5–8.1)

## 2023-09-06 LAB — GLUCOSE, CAPILLARY
Glucose-Capillary: 108 mg/dL — ABNORMAL HIGH (ref 70–99)
Glucose-Capillary: 108 mg/dL — ABNORMAL HIGH (ref 70–99)
Glucose-Capillary: 120 mg/dL — ABNORMAL HIGH (ref 70–99)
Glucose-Capillary: 128 mg/dL — ABNORMAL HIGH (ref 70–99)
Glucose-Capillary: 133 mg/dL — ABNORMAL HIGH (ref 70–99)
Glucose-Capillary: 136 mg/dL — ABNORMAL HIGH (ref 70–99)
Glucose-Capillary: 82 mg/dL (ref 70–99)
Glucose-Capillary: 88 mg/dL (ref 70–99)
Glucose-Capillary: 93 mg/dL (ref 70–99)
Glucose-Capillary: 97 mg/dL (ref 70–99)
Glucose-Capillary: 97 mg/dL (ref 70–99)
Glucose-Capillary: 97 mg/dL (ref 70–99)
Glucose-Capillary: 98 mg/dL (ref 70–99)
Glucose-Capillary: 98 mg/dL (ref 70–99)

## 2023-09-06 LAB — HEPATITIS PANEL, ACUTE
HCV Ab: NONREACTIVE
Hep A IgM: NONREACTIVE
Hep B C IgM: NONREACTIVE
Hepatitis B Surface Ag: NONREACTIVE

## 2023-09-06 LAB — CBC
HCT: 31.1 % — ABNORMAL LOW (ref 36.0–46.0)
Hemoglobin: 10.6 g/dL — ABNORMAL LOW (ref 12.0–15.0)
MCH: 30.9 pg (ref 26.0–34.0)
MCHC: 34.1 g/dL (ref 30.0–36.0)
MCV: 90.7 fL (ref 80.0–100.0)
Platelets: 163 10*3/uL (ref 150–400)
RBC: 3.43 MIL/uL — ABNORMAL LOW (ref 3.87–5.11)
RDW: 15.1 % (ref 11.5–15.5)
WBC: 9.8 10*3/uL (ref 4.0–10.5)
nRBC: 1 % — ABNORMAL HIGH (ref 0.0–0.2)

## 2023-09-06 LAB — HEMOGLOBIN A1C
Hgb A1c MFr Bld: 8.1 % — ABNORMAL HIGH (ref 4.8–5.6)
Mean Plasma Glucose: 185.77 mg/dL

## 2023-09-06 LAB — TRIGLYCERIDES
Triglycerides: >5000 mg/dL — ABNORMAL HIGH (ref <150)
Triglycerides: 1518 mg/dL — ABNORMAL HIGH (ref <150)

## 2023-09-06 LAB — MAGNESIUM: Magnesium: 1.9 mg/dL (ref 1.7–2.4)

## 2023-09-06 LAB — PHOSPHORUS: Phosphorus: 2.9 mg/dL (ref 2.5–4.6)

## 2023-09-06 MED ORDER — DEXTROSE 5 % IV SOLN
INTRAVENOUS | Status: AC
Start: 2023-09-06 — End: 2023-09-07

## 2023-09-06 MED ORDER — POTASSIUM CHLORIDE CRYS ER 20 MEQ PO TBCR
40.0000 meq | EXTENDED_RELEASE_TABLET | ORAL | Status: AC
  Administered 2023-09-06 (×2): 40 meq via ORAL
  Filled 2023-09-06 (×2): qty 2

## 2023-09-06 MED ORDER — LACTATED RINGERS IV SOLN: INTRAVENOUS | Status: AC

## 2023-09-06 MED ORDER — SIMETHICONE 80 MG PO CHEW
80.0000 mg | CHEWABLE_TABLET | Freq: Four times a day (QID) | ORAL | Status: DC
  Administered 2023-09-06 – 2023-09-09 (×9): 80 mg via ORAL
  Filled 2023-09-06 (×9): qty 1

## 2023-09-06 MED ORDER — SODIUM BICARBONATE 650 MG PO TABS
650.0000 mg | ORAL_TABLET | Freq: Two times a day (BID) | ORAL | Status: AC
  Administered 2023-09-06 – 2023-09-08 (×6): 650 mg via ORAL
  Filled 2023-09-06 (×6): qty 1

## 2023-09-06 NOTE — Plan of Care (Signed)
Nutrition Education Note  RD consulted for nutrition education regarding diabetes.   Lab Results  Component Value Date   HGBA1C 8.1 (H) 09/06/2023   Food recall as below: Breakfast: eggs, tortilla, tomatoes Lunch: protein with rice and beans Dinner: small snack with coffee or quesadilla Drinks: water, lemonade  Patient reports no changes in weight. Was eating normally until pain started that caused her to present to ED. Notes she prefers salty foods over sweet foods.  RD provided "Carbohydrate Counting for People with Diabetes" and "Using Nutrition Labels: Carbohydrate" handouts from the Academy of Nutrition and Dietetics. Discussed different food groups and their effects on blood sugar, emphasizing carbohydrate-containing foods. Provided list of carbohydrates and recommended serving sizes of common foods.  Discussed importance of controlled and consistent carbohydrate intake throughout the day. Provided examples of ways to balance meals/snacks and encouraged intake of high-fiber, whole grain complex carbohydrates. Teach back method used.  Also provided Pancreatitis Nutrition Therapy handout for patient reference as well.  Expect fair compliance.  Body mass index is 25.86 kg/m. Pt meets criteria for overweight based on current BMI.  Current diet order is Clear Liquid, no meal intakes documented at this time. Patient refusing nutrition supplements at this time.  Labs and medications reviewed. No further nutrition interventions warranted at this time. RD contact information provided. If additional nutrition issues arise, please re-consult RD.  Shelle Iron RD, LDN For contact information, refer to [facility].

## 2023-09-06 NOTE — Evaluation (Signed)
Occupational Therapy Evaluation Patient Details Name: Robyn Rivera MRN: [id number] DOB: 01-07-1976 Today's Date: 09/06/2023   History of Present Illness Patient is a 47 year old female who presented on 11/24 with abdominal pain, emesis, and nausea. Patient was admitted with hypertriglyceridemia induced acute pancreatitis, hyperlipidemia, and dehydration. PMH: hyperlipidemia, hypertriglyceridemia induced pancreatitis.   Clinical Impression   Patient is a 47 year old female who was admitted for above. Patient was living at home independently for ADLs prior level. Currently, patient is CGA for mobility and max A for LB dressing/bathing with increased pain in sides with attempts at figure four positioning. Patient's significant other in room eager to help patient with needed tasks.  Patient plans to d/c home with family support at time of d/c. Patient would benefit from continued skilled OT services for AE education to increase independence in ADLs.       If plan is discharge home, recommend the following: A little help with walking and/or transfers;A little help with bathing/dressing/bathroom;Assistance with cooking/housework;Direct supervision/assist for medications management;Assist for transportation;Help with stairs or ramp for entrance;Direct supervision/assist for financial management    Functional Status Assessment  Patient has had a recent decline in their functional status and demonstrates the ability to make significant improvements in function in a reasonable and predictable amount of time.  Equipment Recommendations  None recommended by OT       Precautions / Restrictions Precautions Precautions: Fall Precaution Comments: right drain Restrictions Weight Bearing Restrictions: No      Mobility Bed Mobility Overal bed mobility: Modified Independent                  Transfers Overall transfer level: Needs assistance Equipment used: None Transfers: Sit to/from  Stand Sit to Stand: Supervision                  Balance Overall balance assessment: Mild deficits observed, not formally tested         ADL either performed or assessed with clinical judgement   ADL Overall ADL's : Needs assistance/impaired Eating/Feeding: Modified independent;Sitting   Grooming: Wash/dry hands;Standing;Supervision/safety   Upper Body Bathing: Set up;Sitting   Lower Body Bathing: Maximal assistance;Sitting/lateral leans Lower Body Bathing Details (indicate cue type and reason): unable to figure four legs with increased pain in trunk Upper Body Dressing : Set up;Sitting   Lower Body Dressing: Maximal assistance;Sitting/lateral leans   Toilet Transfer: Contact guard assist;Ambulation Toilet Transfer Details (indicate cue type and reason): no AD Toileting- Clothing Manipulation and Hygiene: Contact guard assist;Sit to/from stand               Vision Patient Visual Report: No change from baseline              Pertinent Vitals/Pain Pain Assessment Pain Assessment: 0-10 Pain Score: 6  Pain Location: mid abdomen, HA Pain Descriptors / Indicators: Discomfort, Guarding, Grimacing Pain Intervention(s): Limited activity within patient's tolerance, Monitored during session, Patient requesting pain meds-RN notified     Extremity/Trunk Assessment Upper Extremity Assessment Upper Extremity Assessment: Overall WFL for tasks assessed (reported numbness in thumb and index finger)   Lower Extremity Assessment Lower Extremity Assessment: Defer to PT evaluation   Cervical / Trunk Assessment Cervical / Trunk Assessment: Normal   Communication Communication Communication: No apparent difficulties   Cognition Arousal: Alert Behavior During Therapy: WFL for tasks assessed/performed Overall Cognitive Status: Within Functional Limits for tasks assessed  Home Living Family/patient expects to be discharged to:: Private  residence Living Arrangements: Spouse/significant other Available Help at Discharge: Family;Available 24 hours/day Type of Home: House                       Home Equipment: None   Additional Comments: patient lives at home with her fiance who can assist her as needed for ADLs. has two adult children and two teens at home      Prior Functioning/Environment Prior Level of Function : Independent/Modified Independent                        OT Problem List: Decreased activity tolerance;Impaired balance (sitting and/or standing);Decreased coordination;Decreased safety awareness;Decreased knowledge of precautions;Decreased knowledge of use of DME or AE      OT Treatment/Interventions: Self-care/ADL training;Patient/family education;Balance training;Therapeutic activities    OT Goals(Current goals can be found in the care plan section) Acute Rehab OT Goals Patient Stated Goal: to go back home OT Goal Formulation: With patient Time For Goal Achievement: 09/20/23 Potential to Achieve Goals: Fair  OT Frequency: Min 1X/week    Co-evaluation PT/OT/SLP Co-Evaluation/Treatment: Yes Reason for Co-Treatment: To address functional/ADL transfers PT goals addressed during session: Mobility/safety with mobility OT goals addressed during session: ADL's and self-care      AM-PAC OT "6 Clicks" Daily Activity     Outcome Measure Help from another person eating meals?: None Help from another person taking care of personal grooming?: A Little Help from another person toileting, which includes using toliet, bedpan, or urinal?: A Little Help from another person bathing (including washing, rinsing, drying)?: A Little Help from another person to put on and taking off regular upper body clothing?: A Little Help from another person to put on and taking off regular lower body clothing?: A Lot 6 Click Score: 18   End of Session Equipment Utilized During Treatment: Gait belt Nurse  Communication: Patient requests pain meds  Activity Tolerance: Patient tolerated treatment well Patient left: in bed;with call bell/phone within reach;with bed alarm set  OT Visit Diagnosis: Unsteadiness on feet (R26.81);Other abnormalities of gait and mobility (R26.89);Muscle weakness (generalized) (M62.81)                Time: 0981-1914 OT Time Calculation (min): 20 min Charges:  OT General Charges $OT Visit: 1 Visit OT Evaluation $OT Eval Low Complexity: 1 Low  Itzia Cunliffe OTR/L, MS Acute Rehabilitation Department Office# [phone]   Selinda Flavin 09/06/2023, 1:14 PM

## 2023-09-06 NOTE — Inpatient Diabetes Management (Addendum)
Inpatient Diabetes Program Recommendations  AACE/ADA: New Consensus Statement on Inpatient Glycemic Control (2015)  Target Ranges:  Prepandial:   less than 140 mg/dL      Peak postprandial:   less than 180 mg/dL (1-2 hours)      Critically ill patients:  140 - 180 mg/dL   Lab Results  Component Value Date   GLUCAP 133 (H) 09/06/2023   HGBA1C 8.1 (H) 09/06/2023    Review of Glycemic Control  Latest Reference Range & Units 09/06/23 00:51 09/06/23 02:04 09/06/23 03:16 09/06/23 04:33 09/06/23 05:40 09/06/23 07:36 09/06/23 09:12 09/06/23 11:37  Glucose-Capillary 70 - 99 mg/dL 93 98 97 88 98 97 161 (H) 133 (H)   Diabetes history: NEW DIAGNOSIS OF DM Outpatient Diabetes medications:  NONE Current orders for Inpatient glycemic control:  IV insulin for hypertriglyceridemia induced pancreatitis  Inpatient Diabetes Program Recommendations:    Will see patient regarding new DM diagnosis.  Will order LWWD booklet for patient as well.   Addendum: 1400- Spoke with patient regarding new DM diagnosis. Patient states she was told in the past that she had pre-diabetes and she was started on metformin however she states she lost some weight and was able to come off of it.  We discussed her current A1C of 8.1% and that this indicates average glucose of  185.7.  Discussed that goal A1C is <7%.  Patient is currently on insulin drip for hypertriglyceridemia.  We discussed potential need for restart of metformin and possibly other medications for elevated A1C.  Patient appreciative of visit.  I told her that I ordered book for her re. Diabetes.  She also would benefit from dietician consult and meter at d/c for monitoring.  Will follow.  Thanks  Beryl Meager, RN, BC-ADM Inpatient Diabetes Coordinator Pager [phone]  (8a-5p)

## 2023-09-06 NOTE — Evaluation (Signed)
Physical Therapy Evaluation Patient Details Name: Robyn Rivera MRN: [id number] DOB: 04-08-1976 Today's Date: 09/06/2023  History of Present Illness  Patient is a 47 year old female who presented on 11/24 with abdominal pain, emesis, and nausea. Patient was admitted with hypertriglyceridemia induced acute pancreatitis, hyperlipidemia, and dehydration. PMH: hyperlipidemia, hypertriglyceridemia induced pancreatitis.  Clinical Impression  Pt admitted with above diagnosis.  Pt currently with functional limitations due to the deficits listed below (see PT Problem List). Pt will benefit from acute skilled PT to increase their independence and safety with mobility to allow discharge.     The patient reporting abdominal pain and HA. Patient did ambulate x 100' and reported dizziness, sat and rested then ambulated 100' more. Patient also reporting numbness in index fingers and toes. Patient should progress to return home with family support.       If plan is discharge home, recommend the following: Assistance with cooking/housework;Assist for transportation;Help with stairs or ramp for entrance   Can travel by private vehicle        Equipment Recommendations None recommended by PT  Recommendations for Other Services       Functional Status Assessment Patient has had a recent decline in their functional status and demonstrates the ability to make significant improvements in function in a reasonable and predictable amount of time.     Precautions / Restrictions Precautions Precautions: Fall Precaution Comments: right drain Restrictions Weight Bearing Restrictions: No      Mobility  Bed Mobility Overal bed mobility: Modified Independent                  Transfers Overall transfer level: Needs assistance Equipment used: None Transfers: Sit to/from Stand Sit to Stand: Supervision                Ambulation/Gait Ambulation/Gait assistance: Supervision, Contact guard  assist Gait Distance (Feet): 100 Feet (then 100' with HHA) Assistive device: None Gait Pattern/deviations: Step-through pattern Gait velocity: decr     General Gait Details: patient reported feeling dizziness, 1 seated rest break.  Stairs            Wheelchair Mobility     Tilt Bed    Modified Rankin (Stroke Patients Only)       Balance Overall balance assessment: Mild deficits observed, not formally tested                                           Pertinent Vitals/Pain Pain Assessment Pain Assessment: 0-10 Pain Score: 6  Pain Location: mid abdomen, HA Pain Descriptors / Indicators: Discomfort, Guarding, Grimacing Pain Intervention(s): Monitored during session, Patient requesting pain meds-RN notified    Home Living Family/patient expects to be discharged to:: Private residence Living Arrangements: Spouse/significant other Available Help at Discharge: Family;Available 24 hours/day Type of Home: House           Home Equipment: None      Prior Function Prior Level of Function : Independent/Modified Independent                     Extremity/Trunk Assessment        Lower Extremity Assessment Lower Extremity Assessment: Overall WFL for tasks assessed (reports toes feel a little  numb)    Cervical / Trunk Assessment Cervical / Trunk Assessment: Normal  Communication   Communication Communication: No apparent difficulties  Cognition Arousal: Alert Behavior  During Therapy: WFL for tasks assessed/performed Overall Cognitive Status: Within Functional Limits for tasks assessed                                          General Comments      Exercises     Assessment/Plan    PT Assessment Patient needs continued PT services  PT Problem List Decreased strength;Decreased mobility;Decreased activity tolerance;Decreased knowledge of precautions       PT Treatment Interventions Therapeutic activities;Gait  training;Functional mobility training    PT Goals (Current goals can be found in the Care Plan section)  Acute Rehab PT Goals Patient Stated Goal: home PT Goal Formulation: With patient Time For Goal Achievement: 09/20/23 Potential to Achieve Goals: Good    Frequency Min 1X/week     Co-evaluation PT/OT/SLP Co-Evaluation/Treatment: Yes Reason for Co-Treatment: To address functional/ADL transfers PT goals addressed during session: Mobility/safety with mobility OT goals addressed during session: ADL's and self-care       AM-PAC PT "6 Clicks" Mobility  Outcome Measure Help needed turning from your back to your side while in a flat bed without using bedrails?: None Help needed moving from lying on your back to sitting on the side of a flat bed without using bedrails?: None Help needed moving to and from a bed to a chair (including a wheelchair)?: None Help needed standing up from a chair using your arms (e.g., wheelchair or bedside chair)?: None Help needed to walk in hospital room?: A Little Help needed climbing 3-5 steps with a railing? : A Little 6 Click Score: 22    End of Session   Activity Tolerance: Patient limited by pain Patient left: in bed;with call bell/phone within reach;with family/visitor present;with bed alarm set Nurse Communication: Mobility status PT Visit Diagnosis: Unsteadiness on feet (R26.81);Difficulty in walking, not elsewhere classified (R26.2);Pain    Time: 6045-4098 PT Time Calculation (min) (ACUTE ONLY): 20 min   Charges:   PT Evaluation $PT Eval Low Complexity: 1 Low   PT General Charges $$ ACUTE PT VISIT: 1 Visit         Blanchard Kelch PT Acute Rehabilitation Services Office [phone] Weekend [phone]   Rada Hay 09/06/2023, 11:18 AM

## 2023-09-06 NOTE — Progress Notes (Signed)
PROGRESS NOTE    Robyn Rivera  WUX:[id number] DOB: 1976/10/01 DOA: 09/05/2023 PCP: Patient, No Pcp Per    Chief Complaint  Patient presents with   Abdominal Pain    Brief Narrative: Patient is a pleasant 47 year old female history of hypertriglyceridemia induced pancreatitis, hyperlipidemia, now newly diagnosed diabetes mellitus type 2 presents to the ED with sudden onset epigastric and left upper quadrant abdominal pain associated with nausea and emesis.  Workup concerning for an acute pancreatitis with lipase level noted elevated at 89, with a transaminitis, fasting lipid panel with a total cholesterol of 981, triglycerides > 5000.  Patient admitted, placed on insulin drip and serial triglyceride levels ordered.   Assessment & Plan:   Principal Problem:   Acute pancreatitis Active Problems:   Hypertriglyceridemia   Dehydration   Hyponatremia   Transaminitis   New onset type 2 diabetes mellitus (HCC)   Hypercholesterolemia   #1 hypertriglyceridemia induced pancreatitis -Patient admitted with sudden onset epigastric and left upper quadrant abdominal pain with associated nausea and vomiting concerning for acute pancreatitis. -Lipase levels elevated at 89 and patient with a transaminitis with AST of 44, ALT of 53. -Patient states was on Lovaza however has run out for over a year and a half. -Patient denies any ongoing alcohol use. -Lipid panel with a cholesterol of 981, triglycerides >5000. -LFTs trending down. -Repeat triglyceride level this morning > 5000. -Continue serial triglyceride levels every 12 hours. -Check triglyceride levels every 12 hours. -Alcohol level unable to be obtained due to elevated triglyceride levels. -Continue insulin drip at 0.1 units/kg/h, D5W at 75 cc an hour, decrease LR to 75 cc an hour.   -Change CBGs to every 2 hours.   -Continue clear liquid diet and if patient improved symptomatically could potentially advance to a full liquid diet  tomorrow.   -If triglyceride levels still  > 5000 in the next 24 hours we will increase insulin drip to 0.2 units/kg/h.  -Once triglyceride levels < 1000 will place on statin, Tricor, Lovaza. -Once triglyceride level < 500 we will discontinue insulin drip. -Supportive care.   2.  Hyperlipidemia/hypertriglyceridemia -Total cholesterol noted on lipid panel at 981, triglyceride level >5000. -See #1 -Once triglyceride levels < 1000 will start on statin, Tricor, Lovaza.   3.  Dehydration -Continue IV fluids   4.  Transaminitis -Likely secondary to problem #1. -Acute hepatitis panel pending. -Right upper quadrant ultrasound with hepatic steatosis. -Transaminitis trending down and has improved. -Supportive care.   5.  Hyponatremia -Likely secondary to hypovolemic hyponatremia secondary to GI losses from nausea and vomiting. -Improving with hydration.  -Supportive care.  6.  Hypokalemia -Magnesium at 1.9. -Replete.  7.  Newly diagnosed type 2 diabetes mellitus -Hemoglobin A1c 8.4 -Patient currently on the insulin drip secondary to problem #1. -Patient also on D5W. -CBG noted at 97 this morning. -Consult with diabetic coordinator. -Will likely need to be started on oral hypoglycemic agents on discharge with close outpatient follow-up with a PCP.  Patient states no PCP at this time.  DVT prophylaxis: Lovenox Code Status: Full Family Communication: Updated patient and fianc at bedside. Disposition: Home when clinically improved.  Status is: Inpatient Remains inpatient appropriate because: Severity of illness   Consultants:  None  Procedures:  CT abdomen and pelvis 09/05/2023 Right upper quadrant ultrasound 09/05/2023  Antimicrobials:  Anti-infectives (From admission, onward)    None         Subjective: Patient laying in bed with ice pack on her head.  Some complaints  of headache.  Denies any chest pain or shortness of breath.  Still with epigastric abdominal pain  but slowly improving since admission.  Denies any further nausea or emesis.  Tolerating clear liquids.  Fianc at bedside.  Objective: Vitals:   09/06/23 0522 09/06/23 0700 09/06/23 0738 09/06/23 0800  BP:  113/65  101/72  Pulse: 81 83  82  Resp: 17 14  (!) 21  Temp:   99 F (37.2 C)   TempSrc:   Oral   SpO2: 95% 96%  96%  Weight:      Height:        Intake/Output Summary (Last 24 hours) at 09/06/2023 1007 Last data filed at 09/06/2023 0600 Gross per 24 hour  Intake 4499.59 ml  Output --  Net 4499.59 ml   Filed Weights   09/05/23 0528 09/05/23 1602 09/06/23 0500  Weight: 69.9 kg 70.3 kg 70.5 kg    Examination:  General exam: Appears calm and comfortable  Respiratory system: Clear to auscultation. Respiratory effort normal. Cardiovascular system: S1 & S2 heard, RRR. No JVD, murmurs, rubs, gallops or clicks. No pedal edema. Gastrointestinal system: Abdomen is nondistended, soft and tender to palpation in the epigastrium.  Positive bowel sounds.  No rebound.  No guarding.  Central nervous system: Alert and oriented. No focal neurological deficits. Extremities: Symmetric 5 x 5 power. Skin: No rashes, lesions or ulcers Psychiatry: Judgement and insight appear normal. Mood & affect appropriate.     Data Reviewed: I have personally reviewed following labs and imaging studies  CBC: Recent Labs  Lab 09/05/23 0609 09/05/23 1112  WBC 10.2  --   NEUTROABS 7.1  --   HGB 13.4 13.3  HCT 37.4 39.0  MCV 87.2  --   PLT 180  --     Basic Metabolic Panel: Recent Labs  Lab 09/05/23 0609 09/05/23 1112 09/06/23 0320  NA 132* 128* 132*  K 3.8 4.2 3.2*  CL 104  --  103  CO2 18*  --  17*  GLUCOSE 208*  --  98  BUN 12  --  5*  CREATININE 0.68  --  0.78  CALCIUM 8.3*  --  8.6*  MG  --   --  1.9  PHOS  --   --  2.9    GFR: Estimated Creatinine Clearance: 87.5 mL/min (by C-G formula based on SCr of 0.78 mg/dL).  Liver Function Tests: Recent Labs  Lab 09/05/23 0609  09/06/23 0320  AST 44* 30  ALT 50* 40  ALKPHOS 104 79  BILITOT 0.5 1.2*  PROT 6.8 6.3*  ALBUMIN 3.7 3.3*    CBG: Recent Labs  Lab 09/06/23 0316 09/06/23 0433 09/06/23 0540 09/06/23 0736 09/06/23 0912  GLUCAP 97 88 98 97 120*     No results found for this or any previous visit (from the past 240 hour(s)).       Radiology Studies: US Abdomen Limited  Result Date: 09/05/2023 CLINICAL DATA:  [id number] Transaminitis [id number] EXAM: ULTRASOUND ABDOMEN LIMITED RIGHT UPPER QUADRANT COMPARISON:  September 05, 2023 FINDINGS: Gallbladder: Surgically absent. Common bile duct: Diameter: Visualized portion measures 5 mm, within normal limits. Liver: Diffusely increased parenchymal echogenicity with poor acoustic penetrance. There is a near anechoic masslike area noted in the region of the porta hepatis which measures 2.5 x 1.4 x 1.5 cm. This is favored to correspond to the cystic area within the cholecystectomy bed on recent CT and may reflect a cystic duct remnant versus small hepatic cyst. Portal vein is  patent on color Doppler imaging with normal direction of blood flow towards the liver. Other: None. IMPRESSION: 1. Hepatic steatosis. Electronically Signed   By: Meda Klinefelter M.D.   On: 09/05/2023 17:16   CT ABDOMEN PELVIS W CONTRAST  Result Date: 09/05/2023 CLINICAL DATA:  Abdominal pain. Left-sided and radiates to the back. Pain comes and goes. EXAM: CT ABDOMEN AND PELVIS WITH CONTRAST TECHNIQUE: Multidetector CT imaging of the abdomen and pelvis was performed using the standard protocol following bolus administration of intravenous contrast. RADIATION DOSE REDUCTION: This exam was performed according to the departmental dose-optimization program which includes automated exposure control, adjustment of the mA and/or kV according to patient size and/or use of iterative reconstruction technique. CONTRAST:  75mL OMNIPAQUE IOHEXOL 300 MG/ML  SOLN COMPARISON:  07/13/2017 FINDINGS: Lower chest:  Dependent atelectasis noted in the lung bases. Hepatobiliary: The liver shows diffusely decreased attenuation suggesting fat deposition. No suspicious focal abnormality within the liver parenchyma. Gallbladder is surgically absent. Small cystic focus in the cholecystectomy bed is stable, potentially dilated cystic duct remnant. No intrahepatic or extrahepatic biliary dilation. Pancreas: Peripancreatic edema is seen in the pancreatic tail region. No ductal dilatation or underlying mass lesion evident. Subtle loss of parenchymal architecture in the tail the pancreas (27/301) is presumably related to edema. Spleen: No splenomegaly. No suspicious focal mass lesion. Adrenals/Urinary Tract: No adrenal nodule or mass. Kidneys unremarkable. No evidence for hydroureter. The urinary bladder appears normal for the degree of distention. Stomach/Bowel: Stomach is unremarkable. No gastric wall thickening. No evidence of outlet obstruction. Duodenum is normally positioned as is the ligament of Treitz. No small bowel wall thickening. No small bowel dilatation. The terminal ileum is normal. The appendix is normal. No gross colonic mass. No colonic wall thickening. Vascular/Lymphatic: No abdominal aortic aneurysm. No abdominal aortic atherosclerotic calcification. There is no gastrohepatic or hepatoduodenal ligament lymphadenopathy. No retroperitoneal or mesenteric lymphadenopathy. No pelvic sidewall lymphadenopathy. Reproductive: The uterus is unremarkable. 5.3 cm simple appearing cyst identified right adnexal space. No left adnexal mass. Other: No intraperitoneal free fluid. Musculoskeletal: No worrisome lytic or sclerotic osseous abnormality. IMPRESSION: 1. Peripancreatic edema in the pancreatic tail region compatible with acute pancreatitis. Subtle loss of parenchymal architecture in the tail the pancreas is presumably related to edema. Given fullness in the tail of the pancreas, follow-up recommended to ensure complete  resolution. 2. 5.3 cm simple appearing cyst in the right adnexal space. Follow-up pelvic ultrasound recommended in 3-6 months tensure resolution. This recommendation follows ACR consensus guidelines: White Paper of the ACR Incidental Findings Committee II on Adnexal Findings. J Am Coll Radiol [phone]. 3. Hepatic steatosis. Electronically Signed   By: Kennith Center M.D.   On: 09/05/2023 10:42        Scheduled Meds:  Chlorhexidine Gluconate Cloth  6 each Topical Daily   enoxaparin (LOVENOX) injection  40 mg Subcutaneous Daily   potassium chloride  40 mEq Oral Q4H   senna  1 tablet Oral BID   sodium bicarbonate  650 mg Oral BID   sodium chloride flush  3 mL Intravenous Q12H   Continuous Infusions:  dextrose 75 mL/hr at 09/06/23 0904   insulin 0.1 Units/kg/hr (09/06/23 0600)   lactated ringers 100 mL/hr at 09/06/23 0903     LOS: 1 day    Time spent: 40 minutes    Ramiro Harvest, MD [facility]   To contact the attending provider between 7A-7P or the covering provider during after hours 7P-7A, please log into the web site [email or web address] and  access using universal [place] password for that web site. If you do not have the password, please call the hospital operator.  09/06/2023, 10:07 AM

## 2023-09-07 DIAGNOSIS — E876 Hypokalemia: Secondary | ICD-10-CM

## 2023-09-07 LAB — COMPREHENSIVE METABOLIC PANEL
ALT: 31 U/L (ref 0–44)
AST: 23 U/L (ref 15–41)
Albumin: 3.4 g/dL — ABNORMAL LOW (ref 3.5–5.0)
Alkaline Phosphatase: 78 U/L (ref 38–126)
Anion gap: 6 (ref 5–15)
BUN: <5 mg/dL — ABNORMAL LOW (ref 6–20)
CO2: 22 mmol/L (ref 22–32)
Calcium: 8.6 mg/dL — ABNORMAL LOW (ref 8.9–10.3)
Chloride: 103 mmol/L (ref 98–111)
Creatinine, Ser: 0.61 mg/dL (ref 0.44–1.00)
GFR, Estimated: >60 mL/min (ref >60)
Glucose, Bld: 109 mg/dL — ABNORMAL HIGH (ref 70–99)
Potassium: 3.3 mmol/L — ABNORMAL LOW (ref 3.5–5.1)
Sodium: 131 mmol/L — ABNORMAL LOW (ref 135–145)
Total Bilirubin: 1.9 mg/dL — ABNORMAL HIGH (ref <1.2)
Total Protein: 7 g/dL (ref 6.5–8.1)

## 2023-09-07 LAB — GLUCOSE, CAPILLARY
Glucose-Capillary: 100 mg/dL — ABNORMAL HIGH (ref 70–99)
Glucose-Capillary: 95 mg/dL (ref 70–99)
Glucose-Capillary: 94 mg/dL (ref 70–99)
Glucose-Capillary: 86 mg/dL (ref 70–99)
Glucose-Capillary: 166 mg/dL — ABNORMAL HIGH (ref 70–99)
Glucose-Capillary: 100 mg/dL — ABNORMAL HIGH (ref 70–99)
Glucose-Capillary: 127 mg/dL — ABNORMAL HIGH (ref 70–99)

## 2023-09-07 LAB — MAGNESIUM: Magnesium: 2.2 mg/dL (ref 1.7–2.4)

## 2023-09-07 LAB — TRIGLYCERIDES
Triglycerides: 844 mg/dL — ABNORMAL HIGH (ref <150)
Triglycerides: 799 mg/dL — ABNORMAL HIGH (ref <150)

## 2023-09-07 LAB — MRSA NEXT GEN BY PCR, NASAL: MRSA by PCR Next Gen: NOT DETECTED

## 2023-09-07 MED ORDER — SENNOSIDES-DOCUSATE SODIUM 8.6-50 MG PO TABS
1.0000 | ORAL_TABLET | Freq: Two times a day (BID) | ORAL | Status: DC
  Administered 2023-09-07 – 2023-09-09 (×4): 1 via ORAL
  Filled 2023-09-07 (×5): qty 1

## 2023-09-07 MED ORDER — OMEGA-3-ACID ETHYL ESTERS 1 G PO CAPS
1.0000 g | ORAL_CAPSULE | Freq: Two times a day (BID) | ORAL | Status: DC
  Administered 2023-09-07 – 2023-09-09 (×5): 1 g via ORAL
  Filled 2023-09-07 (×6): qty 1

## 2023-09-07 MED ORDER — POTASSIUM CHLORIDE CRYS ER 10 MEQ PO TBCR
40.0000 meq | EXTENDED_RELEASE_TABLET | ORAL | Status: AC
  Administered 2023-09-07 (×2): 40 meq via ORAL
  Filled 2023-09-07 (×2): qty 4

## 2023-09-07 MED ORDER — ATORVASTATIN CALCIUM 40 MG PO TABS
80.0000 mg | ORAL_TABLET | Freq: Every day | ORAL | Status: DC
  Administered 2023-09-07 – 2023-09-09 (×3): 80 mg via ORAL
  Filled 2023-09-07 (×3): qty 2

## 2023-09-07 MED ORDER — POLYETHYLENE GLYCOL 3350 17 G PO PACK
17.0000 g | PACK | Freq: Every day | ORAL | Status: DC
  Administered 2023-09-07 – 2023-09-09 (×2): 17 g via ORAL
  Filled 2023-09-07 (×3): qty 1

## 2023-09-07 MED ORDER — DEXTROSE 5 % IV SOLN: INTRAVENOUS | Status: AC

## 2023-09-07 MED ORDER — FENOFIBRATE 160 MG PO TABS
160.0000 mg | ORAL_TABLET | Freq: Every day | ORAL | Status: DC
  Administered 2023-09-07 – 2023-09-09 (×3): 160 mg via ORAL
  Filled 2023-09-07 (×3): qty 1

## 2023-09-07 MED ORDER — LACTATED RINGERS IV SOLN: INTRAVENOUS | Status: DC

## 2023-09-07 NOTE — Plan of Care (Signed)
  Problem: Clinical Measurements: Goal: Ability to maintain clinical measurements within normal limits will improve Outcome: Progressing   Problem: Activity: Goal: Risk for activity intolerance will decrease Outcome: Progressing   Problem: Nutrition: Goal: Adequate nutrition will be maintained Outcome: Progressing   Problem: Skin Integrity: Goal: Risk for impaired skin integrity will decrease Outcome: Progressing   Problem: Fluid Volume: Goal: Ability to maintain a balanced intake and output will improve Outcome: Progressing   Problem: Metabolic: Goal: Ability to maintain appropriate glucose levels will improve Outcome: Progressing   Problem: Nutritional: Goal: Maintenance of adequate nutrition will improve Outcome: Progressing   Problem: Skin Integrity: Goal: Risk for impaired skin integrity will decrease Outcome: Progressing   Problem: Nutritional: Goal: Maintenance of adequate nutrition will improve Outcome: Progressing

## 2023-09-07 NOTE — Progress Notes (Signed)
Occupational Therapy Treatment Patient Details Name: Robyn Rivera MRN: [id number] DOB: [birth year] Today's Date: 09/07/2023   History of present illness Patient is a 47 year old female who presented on 11/24 with abdominal pain, emesis, and nausea. Patient was admitted with hypertriglyceridemia induced acute pancreatitis, hyperlipidemia, and dehydration. PMH: hyperlipidemia, hypertriglyceridemia induced pancreatitis.   OT comments  Patient was educated on AE for LB dressing/bathing tasks to increase independence. Patient verbalized and demonstrated understanding with supervision sitting EOB. Anticipate patient will not need follow up in next level of care. Patient's discharge plan remains appropriate at this time. OT will continue to follow acutely.        If plan is discharge home, recommend the following:  A little help with walking and/or transfers;A little help with bathing/dressing/bathroom;Assistance with cooking/housework;Direct supervision/assist for medications management;Assist for transportation;Help with stairs or ramp for entrance;Direct supervision/assist for financial management   Equipment Recommendations  None recommended by OT       Precautions / Restrictions Precautions Precautions: Fall Precaution Comments: right drain Restrictions Weight Bearing Restrictions: No        ADL either performed or assessed with clinical judgement   ADL Overall ADL's : Needs assistance/impaired                     Lower Body Dressing: Sitting/lateral leans;Supervision/safety Lower Body Dressing Details (indicate cue type and reason): patient was educated on LB dressing with AE. patient was provided with total hip kit on this date. patient was supervision for LB dressing/bathing tasks with AE.                      Cognition Arousal: Alert Behavior During Therapy: WFL for tasks assessed/performed Overall Cognitive Status: Within Functional Limits for tasks  assessed                           Pertinent Vitals/ Pain       Pain Assessment Pain Assessment: Faces Faces Pain Scale: Hurts little more Pain Location: mid abdomen, HA Pain Descriptors / Indicators: Discomfort, Guarding, Grimacing Pain Intervention(s): Limited activity within patient's tolerance, Monitored during session         Frequency  Min 1X/week        Progress Toward Goals  OT Goals(current goals can now be found in the care plan section)  Progress towards OT goals: Progressing toward goals     Plan            End of Session Equipment Utilized During Treatment: Other (comment) (total hip kit)  OT Visit Diagnosis: Unsteadiness on feet (R26.81);Other abnormalities of gait and mobility (R26.89);Muscle weakness (generalized) (M62.81)   Activity Tolerance Patient tolerated treatment well   Patient Left in bed;with call bell/phone within reach;with bed alarm set   Nurse Communication Patient requests pain meds        Time: 7829-5621 OT Time Calculation (min): 14 min  Charges: OT General Charges $OT Visit: 1 Visit OT Treatments $Self Care/Home Management : 8-22 mins  Rosalio Loud, MS Acute Rehabilitation Department Office# [phone]   Selinda Flavin 09/07/2023, 11:45 AM

## 2023-09-07 NOTE — Progress Notes (Signed)
Mobility Specialist - Progress Note   09/07/23 1229  Mobility  Activity Ambulated independently in hallway;Ambulated independently to bathroom  Level of Assistance Independent  Assistive Device None  Distance Ambulated (ft) 350 ft  Activity Response Tolerated well  Mobility Referral Yes  $Mobility charge 1 Mobility  Mobility Specialist Start Time (ACUTE ONLY) 1217  Mobility Specialist Stop Time (ACUTE ONLY) 1228  Mobility Specialist Time Calculation (min) (ACUTE ONLY) 11 min   Pt received in bed and agreeable to mobility. No complaints during session. Pt to bed after session with all needs met.    [place]

## 2023-09-07 NOTE — Progress Notes (Addendum)
PROGRESS NOTE    Robyn Rivera  ZOX:[id number] DOB: [birth year] DOA: 09/05/2023 PCP: Patient, No Pcp Per    Chief Complaint  Patient presents with   Abdominal Pain    Brief Narrative: Patient is a pleasant 47 year old female history of hypertriglyceridemia induced pancreatitis, hyperlipidemia, now newly diagnosed diabetes mellitus type 2 presents to the ED with sudden onset epigastric and left upper quadrant abdominal pain associated with nausea and emesis.  Workup concerning for an acute pancreatitis with lipase level noted elevated at 89, with a transaminitis, fasting lipid panel with a total cholesterol of 981, triglycerides > 5000.  Patient admitted, placed on insulin drip and serial triglyceride levels ordered.   Assessment & Plan:   Principal Problem:   Acute pancreatitis Active Problems:   Hypertriglyceridemia   Dehydration   Hyponatremia   Transaminitis   New onset type 2 diabetes mellitus (HCC)   Hypercholesterolemia   #1 hypertriglyceridemia induced pancreatitis -Patient admitted with sudden onset epigastric and left upper quadrant abdominal pain with associated nausea and vomiting concerning for acute pancreatitis. -Lipase levels elevated at 89 and patient with a transaminitis with AST of 44, ALT of 53. -Patient states was on Lovaza however has run out for over a year and a half. -Patient denies any ongoing alcohol use. -Lipid panel on presentation with a cholesterol of 981, triglycerides >5000. -LFTs trending down. -Repeat triglyceride level this morning at 884 from 1518 from> 5000. -Continue serial triglyceride levels every 12 hours. -Alcohol level unable to be obtained due to elevated triglyceride levels. -Continue insulin drip at 0.1 units/kg/h, D5W at 75 cc an hour, LR 75 cc an hour.   -Change CBGs to every 4 hours.   -Will advance to a full liquid diet.  -Continue current dose of insulin drip. -Start statin, Tricor, Lovaza. -Supportive care. -Patient  does endorse a family history of hypertriglyceridemia induced pancreatitis in her sisters.   2.  Hyperlipidemia/hypertriglyceridemia -Total cholesterol noted on lipid panel at 981, triglyceride level >5000. -See #1 -Triglyceride levels trending down on insulin drip currently at 884 from 1518 from >5000. -Will start patient on statin, Tricor Lovaza. -Patient does endorse family history of hypertriglyceridemia. -Will need outpatient follow-up with PCP for continued management. -May need outpatient referral to endocrinology.   3.  Dehydration -Continue IV fluids   4.  Transaminitis -Likely secondary to problem #1. -Acute hepatitis panel negative. -Right upper quadrant ultrasound with hepatic steatosis. -Transaminitis trending down and resolved.  -Supportive care.   5.  Hyponatremia -Likely secondary to hypovolemic hyponatremia secondary to GI losses from nausea and vomiting. -Improving with hydration.  -Supportive care.  6.  Hypokalemia -Potassium at 3.3, magnesium at 2.2.   -K-Dur 40 mEq p.o. every 4 hours x 2 doses.   7.  Newly diagnosed type 2 diabetes mellitus -Hemoglobin A1c 8.4 -Patient currently on the insulin drip secondary to problem #1. -Patient also on D5W. -CBG noted at 94 this morning. -Patient seen in consultation by diabetic coordinator who she told she had a history of prediabetes and was on metformin before in the past.  -Will likely need to be started on oral hypoglycemic agents on discharge with close outpatient follow-up with a PCP.  Patient states no PCP at this time.  8.  Metabolic acidosis -Improving on bicarb tablets.  DVT prophylaxis: Lovenox Code Status: Full Family Communication: Updated patient.  No family at bedside.  Disposition: Home when clinically improved.  Status is: Inpatient Remains inpatient appropriate because: Severity of illness   Consultants:  None  Procedures:  CT abdomen and pelvis 09/05/2023 Right upper quadrant  ultrasound 09/05/2023  Antimicrobials:  Anti-infectives (From admission, onward)    None         Subjective: Patient sitting on the side of the bed about to use the bedside commode.  States headache improved.  Denies any chest pain or shortness of breath.  Still with epigastric abdominal pain but slowly improving per patient.  No nausea or emesis.  Tolerating clear liquids.  Patient does state she has sisters with same issue of hypertriglyceridemia induced pancreatitis.    Objective: Vitals:   09/07/23 0500 09/07/23 0600 09/07/23 0705 09/07/23 0800  BP: 124/60 (!) 107/50 (!) 106/50 (!) 109/52  Pulse: 86 83 77 81  Resp: 14 11 14 11   Temp:    98.1 F (36.7 C)  TempSrc:    Oral  SpO2: 95% 96% 97% 96%  Weight: 71.8 kg     Height:        Intake/Output Summary (Last 24 hours) at 09/07/2023 0944 Last data filed at 09/07/2023 0600 Gross per 24 hour  Intake 3161.17 ml  Output --  Net 3161.17 ml   Filed Weights   09/05/23 1602 09/06/23 0500 09/07/23 0500  Weight: 70.3 kg 70.5 kg 71.8 kg    Examination:  General exam: Appears calm and comfortable  Respiratory system: Lungs clear to auscultation bilaterally.  No wheezes, no crackles, no rhonchi.  Fair air movement.  Speaking in full sentences.  No use of accessory muscles of respiration.   Cardiovascular system: Regular rate and rhythm no murmurs rubs or gallops.  No JVD.  No lower extremity edema.  Gastrointestinal system: Abdomen is soft, nondistended, decreasing tenderness to palpation epigastrium.  Positive bowel sounds.  No rebound.  No guarding.  Central nervous system: Alert and oriented. No focal neurological deficits. Extremities: Symmetric 5 x 5 power. Skin: No rashes, lesions or ulcers Psychiatry: Judgement and insight appear normal. Mood & affect appropriate.     Data Reviewed: I have personally reviewed following labs and imaging studies  CBC: Recent Labs  Lab 09/05/23 0609 09/05/23 1112 09/06/23 0802   WBC 10.2  --  9.8  NEUTROABS 7.1  --   --   HGB 13.4 13.3 10.6*  HCT 37.4 39.0 31.1*  MCV 87.2  --  90.7  PLT 180  --  163    Basic Metabolic Panel: Recent Labs  Lab 09/05/23 0609 09/05/23 1112 09/06/23 0320 09/07/23 0607  NA 132* 128* 132* 131*  K 3.8 4.2 3.2* 3.3*  CL 104  --  103 103  CO2 18*  --  17* 22  GLUCOSE 208*  --  98 109*  BUN 12  --  5* <5*  CREATININE 0.68  --  0.78 0.61  CALCIUM 8.3*  --  8.6* 8.6*  MG  --   --  1.9 2.2  PHOS  --   --  2.9  --     GFR: Estimated Creatinine Clearance: 88.2 mL/min (by C-G formula based on SCr of 0.61 mg/dL).  Liver Function Tests: Recent Labs  Lab 09/05/23 0609 09/06/23 0320 09/07/23 0607  AST 44* 30 23  ALT 50* 40 31  ALKPHOS 104 79 78  BILITOT 0.5 1.2* 1.9*  PROT 6.8 6.3* 7.0  ALBUMIN 3.7 3.3* 3.4*    CBG: Recent Labs  Lab 09/06/23 2335 09/07/23 0227 09/07/23 0406 09/07/23 0616 09/07/23 0806  GLUCAP 108* 100* 95 94 86     Recent Results (from the past 240  hour(s))  MRSA Next Gen by PCR, Nasal     Status: None   Collection Time: 09/07/23 12:49 AM   Specimen: Nasal Mucosa; Nasal Swab  Result Value Ref Range Status   MRSA by PCR Next Gen NOT DETECTED NOT DETECTED Final    Comment: (NOTE) The GeneXpert MRSA Assay (FDA approved for NASAL specimens only), is one component of a comprehensive MRSA colonization surveillance program. It is not intended to diagnose MRSA infection nor to guide or monitor treatment for MRSA infections. Test performance is not FDA approved in patients less than 47 years old. Performed at [facility], [address] [place], [place] [address]          Radiology Studies: US Abdomen Limited  Result Date: 09/05/2023 CLINICAL DATA:  [id number] Transaminitis [id number] EXAM: ULTRASOUND ABDOMEN LIMITED RIGHT UPPER QUADRANT COMPARISON:  September 05, 2023 FINDINGS: Gallbladder: Surgically absent. Common bile duct: Diameter: Visualized portion measures 5 mm,  within normal limits. Liver: Diffusely increased parenchymal echogenicity with poor acoustic penetrance. There is a near anechoic masslike area noted in the region of the porta hepatis which measures 2.5 x 1.4 x 1.5 cm. This is favored to correspond to the cystic area within the cholecystectomy bed on recent CT and may reflect a cystic duct remnant versus small hepatic cyst. Portal vein is patent on color Doppler imaging with normal direction of blood flow towards the liver. Other: None. IMPRESSION: 1. Hepatic steatosis. Electronically Signed   By: Meda Klinefelter M.D.   On: 09/05/2023 17:16   CT ABDOMEN PELVIS W CONTRAST  Result Date: 09/05/2023 CLINICAL DATA:  Abdominal pain. Left-sided and radiates to the back. Pain comes and goes. EXAM: CT ABDOMEN AND PELVIS WITH CONTRAST TECHNIQUE: Multidetector CT imaging of the abdomen and pelvis was performed using the standard protocol following bolus administration of intravenous contrast. RADIATION DOSE REDUCTION: This exam was performed according to the departmental dose-optimization program which includes automated exposure control, adjustment of the mA and/or kV according to patient size and/or use of iterative reconstruction technique. CONTRAST:  75mL OMNIPAQUE IOHEXOL 300 MG/ML  SOLN COMPARISON:  07/13/2017 FINDINGS: Lower chest: Dependent atelectasis noted in the lung bases. Hepatobiliary: The liver shows diffusely decreased attenuation suggesting fat deposition. No suspicious focal abnormality within the liver parenchyma. Gallbladder is surgically absent. Small cystic focus in the cholecystectomy bed is stable, potentially dilated cystic duct remnant. No intrahepatic or extrahepatic biliary dilation. Pancreas: Peripancreatic edema is seen in the pancreatic tail region. No ductal dilatation or underlying mass lesion evident. Subtle loss of parenchymal architecture in the tail the pancreas (27/301) is presumably related to edema. Spleen: No splenomegaly. No  suspicious focal mass lesion. Adrenals/Urinary Tract: No adrenal nodule or mass. Kidneys unremarkable. No evidence for hydroureter. The urinary bladder appears normal for the degree of distention. Stomach/Bowel: Stomach is unremarkable. No gastric wall thickening. No evidence of outlet obstruction. Duodenum is normally positioned as is the ligament of Treitz. No small bowel wall thickening. No small bowel dilatation. The terminal ileum is normal. The appendix is normal. No gross colonic mass. No colonic wall thickening. Vascular/Lymphatic: No abdominal aortic aneurysm. No abdominal aortic atherosclerotic calcification. There is no gastrohepatic or hepatoduodenal ligament lymphadenopathy. No retroperitoneal or mesenteric lymphadenopathy. No pelvic sidewall lymphadenopathy. Reproductive: The uterus is unremarkable. 5.3 cm simple appearing cyst identified right adnexal space. No left adnexal mass. Other: No intraperitoneal free fluid. Musculoskeletal: No worrisome lytic or sclerotic osseous abnormality. IMPRESSION: 1. Peripancreatic edema in the pancreatic tail region compatible with acute  pancreatitis. Subtle loss of parenchymal architecture in the tail the pancreas is presumably related to edema. Given fullness in the tail of the pancreas, follow-up recommended to ensure complete resolution. 2. 5.3 cm simple appearing cyst in the right adnexal space. Follow-up pelvic ultrasound recommended in 3-6 months tensure resolution. This recommendation follows ACR consensus guidelines: White Paper of the ACR Incidental Findings Committee II on Adnexal Findings. J Am Coll Radiol [id number]. 3. Hepatic steatosis. Electronically Signed   By: Kennith Center M.D.   On: 09/05/2023 10:42        Scheduled Meds:  Chlorhexidine Gluconate Cloth  6 each Topical Daily   enoxaparin (LOVENOX) injection  40 mg Subcutaneous Daily   potassium chloride  40 mEq Oral Q4H   senna  1 tablet Oral BID   simethicone  80 mg Oral QID    sodium bicarbonate  650 mg Oral BID   sodium chloride flush  3 mL Intravenous Q12H   Continuous Infusions:  dextrose 100 mL/hr at 09/07/23 0840   insulin 0.1 Units/kg/hr (09/07/23 0704)   lactated ringers 75 mL/hr at 09/07/23 0600     LOS: 2 days    Time spent: 40 minutes    Ramiro Harvest, MD [facility]   To contact the attending provider between 7A-7P or the covering provider during after hours 7P-7A, please log into the web site [email or web address] and access using universal [place] password for that web site. If you do not have the password, please call the hospital operator.  09/07/2023, 9:44 AM

## 2023-09-07 NOTE — Plan of Care (Signed)
  Problem: Education: Goal: Knowledge of General Education information will improve Description: Including pain rating scale, medication(s)/side effects and non-pharmacologic comfort measures Outcome: Progressing   Problem: Health Behavior/Discharge Planning: Goal: Ability to manage health-related needs will improve Outcome: Progressing   Problem: Clinical Measurements: Goal: Ability to maintain clinical measurements within normal limits will improve Outcome: Progressing Goal: Will remain free from infection Outcome: Progressing Goal: Diagnostic test results will improve Outcome: Progressing Goal: Respiratory complications will improve Outcome: Progressing Goal: Cardiovascular complication will be avoided Outcome: Progressing   Problem: Activity: Goal: Risk for activity intolerance will decrease Outcome: Progressing   Problem: Nutrition: Goal: Adequate nutrition will be maintained Outcome: Progressing   Problem: Coping: Goal: Level of anxiety will decrease Outcome: Progressing   Problem: Elimination: Goal: Will not experience complications related to bowel motility Outcome: Progressing Goal: Will not experience complications related to urinary retention Outcome: Progressing   Problem: Pain Management: Goal: General experience of comfort will improve Outcome: Progressing   Problem: Safety: Goal: Ability to remain free from injury will improve Outcome: Progressing   Problem: Skin Integrity: Goal: Risk for impaired skin integrity will decrease Outcome: Progressing   Problem: Education: Goal: Ability to describe self-care measures that may prevent or decrease complications (Diabetes Survival Skills Education) will improve Outcome: Progressing   Problem: Coping: Goal: Ability to adjust to condition or change in health will improve Outcome: Progressing   Problem: Fluid Volume: Goal: Ability to maintain a balanced intake and output will improve Outcome:  Progressing   Problem: Health Behavior/Discharge Planning: Goal: Ability to identify and utilize available resources and services will improve Outcome: Progressing Goal: Ability to manage health-related needs will improve Outcome: Progressing   Problem: Metabolic: Goal: Ability to maintain appropriate glucose levels will improve Outcome: Progressing   Problem: Nutritional: Goal: Maintenance of adequate nutrition will improve Outcome: Progressing Goal: Progress toward achieving an optimal weight will improve Outcome: Progressing   Problem: Skin Integrity: Goal: Risk for impaired skin integrity will decrease Outcome: Progressing   Problem: Tissue Perfusion: Goal: Adequacy of tissue perfusion will improve Outcome: Progressing   Cindy S. Clelia Croft BSN, RN, CCRP, CCRN 09/07/2023 2:55 AM

## 2023-09-08 LAB — GLUCOSE, CAPILLARY
Glucose-Capillary: 100 mg/dL — ABNORMAL HIGH (ref 70–99)
Glucose-Capillary: 101 mg/dL — ABNORMAL HIGH (ref 70–99)
Glucose-Capillary: 102 mg/dL — ABNORMAL HIGH (ref 70–99)
Glucose-Capillary: 144 mg/dL — ABNORMAL HIGH (ref 70–99)
Glucose-Capillary: 59 mg/dL — ABNORMAL LOW (ref 70–99)
Glucose-Capillary: 67 mg/dL — ABNORMAL LOW (ref 70–99)
Glucose-Capillary: 68 mg/dL — ABNORMAL LOW (ref 70–99)
Glucose-Capillary: 84 mg/dL (ref 70–99)
Glucose-Capillary: 91 mg/dL (ref 70–99)

## 2023-09-08 LAB — COMPREHENSIVE METABOLIC PANEL
ALT: 32 U/L (ref 0–44)
ALT: 42 U/L (ref 0–44)
AST: 26 U/L (ref 15–41)
AST: 20 U/L (ref 15–41)
Albumin: 3.4 g/dL — ABNORMAL LOW (ref 3.5–5.0)
Albumin: 3.5 g/dL (ref 3.5–5.0)
Alkaline Phosphatase: 82 U/L (ref 38–126)
Alkaline Phosphatase: 79 U/L (ref 38–126)
Anion gap: 9 (ref 5–15)
Anion gap: 10 (ref 5–15)
BUN: <5 mg/dL — ABNORMAL LOW (ref 6–20)
BUN: 6 mg/dL (ref 6–20)
CO2: 23 mmol/L (ref 22–32)
CO2: 22 mmol/L (ref 22–32)
Calcium: 8.9 mg/dL (ref 8.9–10.3)
Calcium: 8.7 mg/dL — ABNORMAL LOW (ref 8.9–10.3)
Chloride: 103 mmol/L (ref 98–111)
Chloride: 101 mmol/L (ref 98–111)
Creatinine, Ser: 0.63 mg/dL (ref 0.44–1.00)
Creatinine, Ser: 0.59 mg/dL (ref 0.44–1.00)
GFR, Estimated: >60 mL/min (ref >60)
GFR, Estimated: >60 mL/min (ref >60)
Glucose, Bld: 101 mg/dL — ABNORMAL HIGH (ref 70–99)
Glucose, Bld: 86 mg/dL (ref 70–99)
Potassium: 3.9 mmol/L (ref 3.5–5.1)
Potassium: 3.8 mmol/L (ref 3.5–5.1)
Sodium: 135 mmol/L (ref 135–145)
Sodium: 133 mmol/L — ABNORMAL LOW (ref 135–145)
Total Bilirubin: 1.3 mg/dL — ABNORMAL HIGH (ref <1.2)
Total Bilirubin: 1.1 mg/dL (ref <1.2)
Total Protein: 7 g/dL (ref 6.5–8.1)
Total Protein: 7.1 g/dL (ref 6.5–8.1)

## 2023-09-08 LAB — TRIGLYCERIDES
Triglycerides: 926 mg/dL — ABNORMAL HIGH (ref <150)
Triglycerides: 1085 mg/dL — ABNORMAL HIGH (ref <150)

## 2023-09-08 MED ORDER — DEXTROSE 5 % IV SOLN: INTRAVENOUS | Status: DC

## 2023-09-08 NOTE — Progress Notes (Signed)
PROGRESS NOTE    Robyn Rivera  ZOX:[id number] DOB: 12/30/1975 DOA: 09/05/2023 PCP: No primary care provider on file.   Brief Narrative: 47 year old female history of hypertriglyceridemia induced pancreatitis, hyperlipidemia, now newly diagnosed diabetes mellitus type 2 presents to the ED with sudden onset epigastric and left upper quadrant abdominal pain associated with nausea and emesis. Workup concerning for an acute pancreatitis with lipase level noted elevated at 89, with a transaminitis, fasting lipid panel with a total cholesterol of 981, triglycerides > 5000. Patient admitted, placed on insulin drip and serial triglyceride levels ordered.   Assessment & Plan:   Principal Problem:   Acute pancreatitis Active Problems:   Hypertriglyceridemia   Dehydration   Hyponatremia   Transaminitis   New onset type 2 diabetes mellitus (HCC)   Hypercholesterolemia   #1 hypertriglyceridemia induced pancreatitis -Patient admitted with sudden onset epigastric and left upper quadrant abdominal pain with associated nausea and vomiting concerning for acute pancreatitis. -Lipase levels elevated at 89 and patient with a transaminitis with AST of 44, ALT of 53. -Patient states was on Lovaza however has run out for over a year and a half. -Patient denies any ongoing alcohol use. -Lipid panel on presentation with a cholesterol of 981, triglycerides >5000. -LFTs trending down. -Repeat triglyceride level this morning at 1085 from 884 from 1518 from> 5000. -Continue serial triglyceride levels every 24 hours. -Alcohol level unable to be obtained due to elevated triglyceride levels. -Continue insulin drip at 0.1 units/kg/h, D5W at 75 cc an hour, LR 75 cc an hour.   -Change CBGs to every 4 hours.   -Will advance to a full liquid diet.  -Continue current dose of insulin drip. -Start statin, Tricor, Lovaza. -Supportive care. -Patient does endorse a family history of hypertriglyceridemia induced  pancreatitis in her sisters.   2.  Hyperlipidemia/hypertriglyceridemia -Total cholesterol noted on lipid panel at 981, triglyceride level >5000. -See #1 -Triglyceride levels trending down on insulin drip currently at 884 from 1518 from >5000. -Will start patient on statin, Tricor Lovaza. -Patient does endorse family history of hypertriglyceridemia. -Will need outpatient follow-up with PCP for continued management. -May need outpatient referral to endocrinology.   3.  Dehydration -Continue IV fluids   4.  Transaminitis -Likely secondary to problem #1. -Acute hepatitis panel negative. -Right upper quadrant ultrasound with hepatic steatosis. -Transaminitis trending down and resolved.  -Supportive care.   5.  Hyponatremia -Likely secondary to hypovolemic hyponatremia secondary to GI losses from nausea and vomiting. -Improving with hydration.  -Supportive care.   6.  Hypokalemia -Potassium at 3.3, magnesium at 2.2.   -K-Dur 40 mEq p.o. every 4 hours x 2 doses.    7.  Newly diagnosed type 2 diabetes mellitus -Hemoglobin A1c 8.4 -Patient currently on the insulin drip secondary to problem #1. -Patient also on D5W. -CBG noted at 94 this morning. -Patient seen in consultation by diabetic coordinator who she told she had a history of prediabetes and was on metformin before in the past.  -Will likely need to be started on oral hypoglycemic agents on discharge with close outpatient follow-up with a PCP.  Patient states no PCP at this time.   8.  Metabolic acidosis -Improving on bicarb tablets.   Estimated body mass index is 26.3 kg/m as calculated from the following:   Height as of this encounter: 5\' 5"  (1.651 m).   Weight as of this encounter: 71.7 kg.  DVT prophylaxis:lovenox Code Status:full Family Communication: Family in room Disposition Plan:  Status is: Inpatient Remains  inpatient appropriate because: hypertryglyceredemia   Consultants:   none  Procedures:none Antimicrobials: none Subjective: No new c/o  Objective: Vitals:   09/08/23 1102 09/08/23 1130 09/08/23 1145 09/08/23 1310  BP:      Pulse: 81  88 84  Resp: (!) 22  18 16   Temp:  98 F (36.7 C)    TempSrc:  Oral    SpO2: 99%  96% 98%  Weight:      Height:        Intake/Output Summary (Last 24 hours) at 09/08/2023 1508 Last data filed at 09/08/2023 1100 Gross per 24 hour  Intake 4828.34 ml  Output 5250 ml  Net -421.66 ml   Filed Weights   09/06/23 0500 09/07/23 0500 09/08/23 0652  Weight: 70.5 kg 71.8 kg 71.7 kg    Examination:  General exam: Appears in nad Respiratory system: Clear to auscultation. Respiratory effort normal. Cardiovascular system: S1 & S2 heard, RRR. No JVD, murmurs, rubs, gallops or clicks. No pedal edema. Gastrointestinal system: Abdomen is nondistended, soft and nontender. No organomegaly or masses felt. Normal bowel sounds heard. Central nervous system: Alert and oriented. No focal neurological deficits. Extremities: Symmetric 5 x 5 power. Skin: No rashes, lesions or ulcers Psychiatry: Judgement and insight appear normal. Mood & affect appropriate.     Data Reviewed: I have personally reviewed following labs and imaging studies  CBC: Recent Labs  Lab 09/05/23 0609 09/05/23 1112 09/06/23 0802  WBC 10.2  --  9.8  NEUTROABS 7.1  --   --   HGB 13.4 13.3 10.6*  HCT 37.4 39.0 31.1*  MCV 87.2  --  90.7  PLT 180  --  163   Basic Metabolic Panel: Recent Labs  Lab 09/05/23 0609 09/05/23 1112 09/06/23 0320 09/07/23 0607 09/08/23 0259 09/08/23 1034  NA 132* 128* 132* 131* 135 133*  K 3.8 4.2 3.2* 3.3* 3.9 3.8  CL 104  --  103 103 103 101  CO2 18*  --  17* 22 23 22   GLUCOSE 208*  --  98 109* 101* 86  BUN 12  --  5* <5* <5* 6  CREATININE 0.68  --  0.78 0.61 0.63 0.59  CALCIUM 8.3*  --  8.6* 8.6* 8.9 8.7*  MG  --   --  1.9 2.2  --   --   PHOS  --   --  2.9  --   --   --    GFR: Estimated Creatinine  Clearance: 88.2 mL/min (by C-G formula based on SCr of 0.59 mg/dL). Liver Function Tests: Recent Labs  Lab 09/05/23 0609 09/06/23 0320 09/07/23 0607 09/08/23 0259 09/08/23 1034  AST 44* 30 23 26 20   ALT 50* 40 31 32 42  ALKPHOS 104 79 78 82 79  BILITOT 0.5 1.2* 1.9* 1.3* 1.1  PROT 6.8 6.3* 7.0 7.0 7.1  ALBUMIN 3.7 3.3* 3.4* 3.4* 3.5   Recent Labs  Lab 09/05/23 0609  LIPASE 89*   No results for input(s): "AMMONIA" in the last 168 hours. Coagulation Profile: No results for input(s): "INR", "PROTIME" in the last 168 hours. Cardiac Enzymes: No results for input(s): "CKTOTAL", "CKMB", "CKMBINDEX", "TROPONINI" in the last 168 hours. BNP (last 3 results) No results for input(s): "PROBNP" in the last 8760 hours. HbA1C: Recent Labs    09/06/23 0320  HGBA1C 8.1*   CBG: Recent Labs  Lab 09/08/23 0022 09/08/23 0329 09/08/23 0722 09/08/23 0745 09/08/23 1103  GLUCAP 144* 91 67* 100* 84   Lipid Profile: Recent  Labs    09/08/23 0255 09/08/23 1034  TRIG 926* 1,085*   Thyroid Function Tests: No results for input(s): "TSH", "T4TOTAL", "FREET4", "T3FREE", "THYROIDAB" in the last 72 hours. Anemia Panel: No results for input(s): "VITAMINB12", "FOLATE", "FERRITIN", "TIBC", "IRON", "RETICCTPCT" in the last 72 hours. Sepsis Labs: No results for input(s): "PROCALCITON", "LATICACIDVEN" in the last 168 hours.  Recent Results (from the past 240 hour(s))  MRSA Next Gen by PCR, Nasal     Status: None   Collection Time: 09/07/23 12:49 AM   Specimen: Nasal Mucosa; Nasal Swab  Result Value Ref Range Status   MRSA by PCR Next Gen NOT DETECTED NOT DETECTED Final    Comment: (NOTE) The GeneXpert MRSA Assay (FDA approved for NASAL specimens only), is one component of a comprehensive MRSA colonization surveillance program. It is not intended to diagnose MRSA infection nor to guide or monitor treatment for MRSA infections. Test performance is not FDA approved in patients less than 61  years old. Performed at [facility], [address], [place], [place] [address]     Radiology Studies: No results found.   Scheduled Meds:  atorvastatin  80 mg Oral Daily   Chlorhexidine Gluconate Cloth  6 each Topical Daily   enoxaparin (LOVENOX) injection  40 mg Subcutaneous Daily   fenofibrate  160 mg Oral Daily   omega-3 acid ethyl esters  1 g Oral BID   polyethylene glycol  17 g Oral Daily   senna-docusate  1 tablet Oral BID   simethicone  80 mg Oral QID   sodium bicarbonate  650 mg Oral BID   sodium chloride flush  3 mL Intravenous Q12H   Continuous Infusions:  dextrose 100 mL/hr at 09/08/23 0730   insulin 0.1 Units/kg/hr (09/08/23 1309)   lactated ringers 75 mL/hr at 09/08/23 0730     LOS: 3 days    Time spent: 38 min  Alwyn Ren, MD 09/08/2023, 3:08 PM

## 2023-09-08 NOTE — Progress Notes (Signed)
Hypoglycemic Event  CBG: 59  Treatment: 8 oz juice/soda  Symptoms: None  Follow-up CBG: Time: 1615 CBG Result: 68  Possible Reasons for Event: Inadequate meal intake and Medication regimen: Insulin gtt  Comments/MD notified: Jerolyn Center MD notified, see new orders.     Silverio Decamp

## 2023-09-08 NOTE — Care Management (Signed)
Transition of Care [facility]) - Inpatient Brief Assessment   Patient Details  Name: Robyn Rivera MRN: [id number] Date of Birth: 1976/02/05  Transition of Care [facility]) CM/SW Contact:    Lavenia Atlas, RN Phone Number: 09/08/2023, 3:46 PM   Clinical Narrative: Per chart review patient currently in [facility] SDU and is newly diagnosed DM2, with acute pancreatitis. Patient does not have insurance. Received TOC consult for PCP needs and medication assistance. This RNCM attempted to speak with patient with no success, will continue to follow.  TOC following for discharge needs.    Transition of Care Asessment: Insurance and Status: Insurance coverage has been reviewed (Patient does not currently have insurance: Medicaid potential) Patient has primary care physician: No Home environment has been reviewed: from home with family Prior level of function:: independent   Social Determinants of Health Reivew: SDOH reviewed no interventions necessary Readmission risk has been reviewed: Yes Transition of care needs: transition of care needs identified, TOC will continue to follow

## 2023-09-08 NOTE — TOC Initial Note (Addendum)
Transition of Care [facility]) - Initial/Assessment Note    Patient Details  Name: Robyn Rivera MRN: [id number] Date of Birth: 10/13/1975  Transition of Care [facility]) CM/SW Contact:    Lavenia Atlas, RN Phone Number: 09/08/2023, 4:08 PM  Clinical Narrative: This RNCM spoke with patient at bedside who reports she has been approved for [facility] Medicaid 2 weeks ago. May need a MATCH if insurance is not not completely approved as it states potential for Medicaid.Patient reports she has a letter and does not have the insurance card yet. Scheduled PCP appointment to establish care and hospital follow up with [facility] on 09/13/23 at 3:50pm with Dorena Bodo. Will attach to AVS.   Pharmacy: [facility] in [place] on [address] at discharge: family    TOC following for discharge needs             Expected Discharge Plan: Home/Self Care Barriers to Discharge: Continued Medical Work up   Patient Goals and CMS Choice Patient states their goals for this hospitalization and ongoing recovery are:: to feel better CMS Medicare.gov Compare Post Acute Care list provided to:: Patient Choice offered to / list presented to : Patient      Expected Discharge Plan and Services In-house Referral: NA Discharge Planning Services: CM Consult Post Acute Care Choice: NA Living arrangements for the past 2 months: Single Family Home                 DME Arranged: N/A DME Agency: NA       HH Arranged: NA HH Agency: NA        Prior Living Arrangements/Services Living arrangements for the past 2 months: Single Family Home Lives with:: Relatives Patient language and need for interpreter reviewed:: Yes Do you feel safe going back to the place where you live?: Yes      Need for Family Participation in Patient Care: No (Comment) Care giver support system in place?: Yes (comment) Current home services:  (None) Criminal Activity/Legal Involvement Pertinent to Current  Situation/Hospitalization: No - Comment as needed  Activities of Daily Living   ADL Screening (condition at time of admission) Independently performs ADLs?: Yes (appropriate for developmental age) Is the patient deaf or have difficulty hearing?: No Does the patient have difficulty seeing, even when wearing glasses/contacts?: No Does the patient have difficulty concentrating, remembering, or making decisions?: No  Permission Sought/Granted Permission sought to share information with : Case Manager Permission granted to share information with : Yes, Verbal Permission Granted  Share Information with NAME: Case Manager           Emotional Assessment Appearance:: Appears stated age Attitude/Demeanor/Rapport: Gracious Affect (typically observed): Accepting Orientation: : Oriented to Self, Oriented to Place, Oriented to Situation Alcohol / Substance Use: Not Applicable Psych Involvement: No (comment)  Admission diagnosis:  Acute pancreatitis [K85.90] Hypercholesterolemia [E78.00] Hyperglycemia [R73.9] Hypertriglyceridemia [E78.1] Other acute pancreatitis without infection or necrosis [K85.80] Patient Active Problem List   Diagnosis Date Noted   New onset type 2 diabetes mellitus (HCC) 09/06/2023   Hypercholesterolemia 09/06/2023   Acute pancreatitis 09/05/2023   Hypertriglyceridemia 09/05/2023   Dehydration 09/05/2023   Hyponatremia 09/05/2023   Transaminitis 09/05/2023   PCP:  No primary care provider on file. Pharmacy:   [id number] [place], [place] - [address] AT [facility] OF [address] & [address] [place] [address] Phone: [phone] Fax: [phone]  [facility] [phone] - [place] -  [address] AT [facility] [address] [place] [address] Phone: [phone] Fax: [phone]     Social Determinants of Health (SDOH) Social History: SDOH Screenings   Food Insecurity: No Food  Insecurity (09/05/2023)  Housing: Low Risk  (09/05/2023)  Transportation Needs: No Transportation Needs (09/05/2023)  Utilities: Not At Risk (09/05/2023)  Tobacco Use: Low Risk  (09/05/2023)   SDOH Interventions:     Readmission Risk Interventions    09/08/2023    3:39 PM  Readmission Risk Prevention Plan  Post Dischage Appt Not Complete  Appt Comments Patient does not have insurance however will assist closer to time of discharge  Medication Screening Complete  Transportation Screening Complete

## 2023-09-08 NOTE — Plan of Care (Signed)
  Problem: Education: Goal: Knowledge of General Education information will improve Description: Including pain rating scale, medication(s)/side effects and non-pharmacologic comfort measures Outcome: Progressing   Problem: Health Behavior/Discharge Planning: Goal: Ability to manage health-related needs will improve Outcome: Progressing   Problem: Clinical Measurements: Goal: Ability to maintain clinical measurements within normal limits will improve Outcome: Progressing Goal: Will remain free from infection Outcome: Progressing Goal: Diagnostic test results will improve Outcome: Progressing Goal: Respiratory complications will improve Outcome: Progressing Goal: Cardiovascular complication will be avoided Outcome: Progressing   Problem: Activity: Goal: Risk for activity intolerance will decrease Outcome: Progressing   Problem: Nutrition: Goal: Adequate nutrition will be maintained Outcome: Progressing   Problem: Coping: Goal: Level of anxiety will decrease Outcome: Progressing   Problem: Elimination: Goal: Will not experience complications related to bowel motility Outcome: Progressing Goal: Will not experience complications related to urinary retention Outcome: Progressing   Problem: Pain Management: Goal: General experience of comfort will improve Outcome: Progressing   Problem: Safety: Goal: Ability to remain free from injury will improve Outcome: Progressing   Problem: Skin Integrity: Goal: Risk for impaired skin integrity will decrease Outcome: Progressing   Problem: Education: Goal: Ability to describe self-care measures that may prevent or decrease complications (Diabetes Survival Skills Education) will improve Outcome: Progressing   Problem: Coping: Goal: Ability to adjust to condition or change in health will improve Outcome: Progressing   Problem: Fluid Volume: Goal: Ability to maintain a balanced intake and output will improve Outcome:  Progressing   Problem: Health Behavior/Discharge Planning: Goal: Ability to identify and utilize available resources and services will improve Outcome: Progressing Goal: Ability to manage health-related needs will improve Outcome: Progressing   Problem: Metabolic: Goal: Ability to maintain appropriate glucose levels will improve Outcome: Progressing   Problem: Nutritional: Goal: Maintenance of adequate nutrition will improve Outcome: Progressing Goal: Progress toward achieving an optimal weight will improve Outcome: Progressing   Problem: Skin Integrity: Goal: Risk for impaired skin integrity will decrease Outcome: Progressing   Problem: Tissue Perfusion: Goal: Adequacy of tissue perfusion will improve Outcome: Progressing   Cindy S. Clelia Croft BSN, RN, CCRP, CCRN 09/08/2023 12:45 AM

## 2023-09-08 NOTE — Plan of Care (Signed)
  Problem: Education: Goal: Knowledge of General Education information will improve Description: Including pain rating scale, medication(s)/side effects and non-pharmacologic comfort measures Outcome: Progressing   Problem: Health Behavior/Discharge Planning: Goal: Ability to manage health-related needs will improve Outcome: Progressing   Problem: Activity: Goal: Risk for activity intolerance will decrease Outcome: Progressing   Problem: Elimination: Goal: Will not experience complications related to bowel motility Outcome: Progressing Goal: Will not experience complications related to urinary retention Outcome: Progressing   Problem: Pain Management: Goal: General experience of comfort will improve Outcome: Progressing

## 2023-09-08 NOTE — Inpatient Diabetes Management (Addendum)
Inpatient Diabetes Program Recommendations  AACE/ADA: New Consensus Statement on Inpatient Glycemic Control   Target Ranges:  Prepandial:   less than 140 mg/dL      Peak postprandial:   less than 180 mg/dL (1-2 hours)      Critically ill patients:  140 - 180 mg/dL    Latest Reference Range & Units 09/08/23 00:22 09/08/23 03:29 09/08/23 07:22 09/08/23 07:45 09/08/23 11:03  Glucose-Capillary 70 - 99 mg/dL 322 (H) 91 67 (L) 025 (H) 84    Latest Reference Range & Units 09/05/23 11:04 09/06/23 03:20  Hemoglobin A1C 4.8 - 5.6 % 8.4 (H) 8.1 (H)   Review of Glycemic Control  Diabetes history: PreDM hx Outpatient Diabetes medications: Use to take Metformin; not taken in years Current orders for Inpatient glycemic control: IV insulin per pharmacy for hypertriglyceridemia  Recommendation:   Outpatient DM: Please provide Rx for glucose monitoring kit (#[id number]) at discharge.  NOTE: Spoke with patient at bedside regarding new diabetes diagnosis.Inpatient diabetes coordinator talked with patient on 11/25 regarding new DM dx. Followed with patient and reviewed A1C results (8.4% on 09/05/23), basic pathophysiology of DM Type 2, basic home care, importance of checking CBGs and maintaining good CBG control to prevent long-term and short-term complications. Reviewed glucose and A1C goals.  Reviewed signs and symptoms of hyperglycemia and hypoglycemia along with treatment for both. Noted glucose 67 mg/dl at 7:62 am today. Patient denies any symptoms of hypoglycemia when CBG 67 mg/dl.   Discussed impact of nutrition, exercise, stress, sickness, and medications on diabetes control. Patient reports that she recently got Medicaid insurance about 2 weeks ago but she will need to find a PCP.   Discussed that she may be prescribed oral DM medication at discharge. Stressed importance to get established with a PCP for consistent follow up and so that she will be able to get refills on medications.  Patient verbalized  understanding of information discussed and she states that she has no further questions at this time related to diabetes.   Thanks, Orlando Penner, RN, MSN, CDE Diabetes Coordinator Inpatient Diabetes Program [phone] (Team Pager from 8am to 5pm)

## 2023-09-08 NOTE — Progress Notes (Signed)
Hypoglycemic Event  CBG: 67 at 7:22 am  Treatment: 4 oz juice/soda 7:30 am  Symptoms: None  Follow-up CBG: Time:7:45 am CBG Result:100  Possible Reasons for Event: Continuous insulin drip

## 2023-09-09 LAB — COMPREHENSIVE METABOLIC PANEL
ALT: 40 U/L (ref 0–44)
AST: 22 U/L (ref 15–41)
Albumin: 3.5 g/dL (ref 3.5–5.0)
Alkaline Phosphatase: 81 U/L (ref 38–126)
Anion gap: 10 (ref 5–15)
BUN: 6 mg/dL (ref 6–20)
CO2: 21 mmol/L — ABNORMAL LOW (ref 22–32)
Calcium: 8.3 mg/dL — ABNORMAL LOW (ref 8.9–10.3)
Chloride: 99 mmol/L (ref 98–111)
Creatinine, Ser: 0.71 mg/dL (ref 0.44–1.00)
GFR, Estimated: >60 mL/min (ref >60)
Glucose, Bld: 69 mg/dL — ABNORMAL LOW (ref 70–99)
Potassium: 3.2 mmol/L — ABNORMAL LOW (ref 3.5–5.1)
Sodium: 130 mmol/L — ABNORMAL LOW (ref 135–145)
Total Bilirubin: 0.7 mg/dL (ref <1.2)
Total Protein: 7.1 g/dL (ref 6.5–8.1)

## 2023-09-09 LAB — GLUCOSE, CAPILLARY
Glucose-Capillary: 72 mg/dL (ref 70–99)
Glucose-Capillary: 126 mg/dL — ABNORMAL HIGH (ref 70–99)
Glucose-Capillary: 88 mg/dL (ref 70–99)
Glucose-Capillary: 215 mg/dL — ABNORMAL HIGH (ref 70–99)

## 2023-09-09 LAB — TRIGLYCERIDES: Triglycerides: 822 mg/dL — ABNORMAL HIGH (ref <150)

## 2023-09-09 LAB — MAGNESIUM: Magnesium: 2.3 mg/dL (ref 1.7–2.4)

## 2023-09-09 MED ORDER — OMEGA-3-ACID ETHYL ESTERS 1 G PO CAPS: 1.0000 g | ORAL_CAPSULE | Freq: Two times a day (BID) | ORAL | 4 refills | Status: DC

## 2023-09-09 MED ORDER — POTASSIUM CHLORIDE CRYS ER 20 MEQ PO TBCR
40.0000 meq | EXTENDED_RELEASE_TABLET | Freq: Once | ORAL | Status: AC
  Administered 2023-09-09: 40 meq via ORAL
  Filled 2023-09-09: qty 2

## 2023-09-09 MED ORDER — ATORVASTATIN CALCIUM 80 MG PO TABS: 80.0000 mg | ORAL_TABLET | Freq: Every day | ORAL | 4 refills | Status: DC

## 2023-09-09 MED ORDER — ACETAMINOPHEN 325 MG PO TABS: 650.0000 mg | ORAL_TABLET | Freq: Four times a day (QID) | ORAL | Status: AC | PRN

## 2023-09-09 MED ORDER — SENNOSIDES-DOCUSATE SODIUM 8.6-50 MG PO TABS: 1.0000 | ORAL_TABLET | Freq: Two times a day (BID) | ORAL | Status: DC

## 2023-09-09 MED ORDER — POLYETHYLENE GLYCOL 3350 17 G PO PACK: 17.0000 g | PACK | Freq: Every day | ORAL | 0 refills | Status: DC | PRN

## 2023-09-09 MED ORDER — ONDANSETRON HCL 4 MG PO TABS: 4.0000 mg | ORAL_TABLET | Freq: Four times a day (QID) | ORAL | 0 refills | Status: DC | PRN

## 2023-09-09 MED ORDER — FENOFIBRATE 160 MG PO TABS: 160.0000 mg | ORAL_TABLET | Freq: Every day | ORAL | 4 refills | Status: DC

## 2023-09-09 NOTE — Plan of Care (Signed)
  Problem: Education: Goal: Knowledge of General Education information will improve Description: Including pain rating scale, medication(s)/side effects and non-pharmacologic comfort measures Outcome: Progressing   Problem: Health Behavior/Discharge Planning: Goal: Ability to manage health-related needs will improve Outcome: Progressing   Problem: Clinical Measurements: Goal: Will remain free from infection Outcome: Progressing Goal: Diagnostic test results will improve Outcome: Progressing Goal: Respiratory complications will improve Outcome: Progressing Goal: Cardiovascular complication will be avoided Outcome: Progressing   Problem: Activity: Goal: Risk for activity intolerance will decrease Outcome: Progressing   Problem: Coping: Goal: Level of anxiety will decrease Outcome: Progressing   Problem: Elimination: Goal: Will not experience complications related to bowel motility Outcome: Progressing Goal: Will not experience complications related to urinary retention Outcome: Progressing   Problem: Safety: Goal: Ability to remain free from injury will improve Outcome: Progressing   Problem: Skin Integrity: Goal: Risk for impaired skin integrity will decrease Outcome: Progressing   Problem: Education: Goal: Ability to describe self-care measures that may prevent or decrease complications (Diabetes Survival Skills Education) will improve Outcome: Progressing Goal: Individualized Educational Video(s) Outcome: Progressing   Problem: Coping: Goal: Ability to adjust to condition or change in health will improve Outcome: Progressing   Problem: Fluid Volume: Goal: Ability to maintain a balanced intake and output will improve Outcome: Progressing   Problem: Health Behavior/Discharge Planning: Goal: Ability to identify and utilize available resources and services will improve Outcome: Progressing Goal: Ability to manage health-related needs will improve Outcome:  Progressing   Problem: Nutritional: Goal: Maintenance of adequate nutrition will improve Outcome: Progressing Goal: Progress toward achieving an optimal weight will improve Outcome: Progressing   Problem: Skin Integrity: Goal: Risk for impaired skin integrity will decrease Outcome: Progressing   Problem: Tissue Perfusion: Goal: Adequacy of tissue perfusion will improve Outcome: Progressing   Problem: Education: Goal: Ability to describe self-care measures that may prevent or decrease complications (Diabetes Survival Skills Education) will improve Outcome: Progressing Goal: Individualized Educational Video(s) Outcome: Progressing   Problem: Cardiac: Goal: Ability to maintain an adequate cardiac output will improve Outcome: Progressing   Problem: Health Behavior/Discharge Planning: Goal: Ability to identify and utilize available resources and services will improve Outcome: Progressing Goal: Ability to manage health-related needs will improve Outcome: Progressing   Problem: Fluid Volume: Goal: Ability to achieve a balanced intake and output will improve Outcome: Progressing   Problem: Metabolic: Goal: Ability to maintain appropriate glucose levels will improve Outcome: Progressing   Problem: Nutritional: Goal: Maintenance of adequate weight for body size and type will improve Outcome: Progressing   Problem: Respiratory: Goal: Will regain and/or maintain adequate ventilation Outcome: Progressing   Problem: Urinary Elimination: Goal: Ability to achieve and maintain adequate renal perfusion and functioning will improve Outcome: Progressing

## 2023-09-09 NOTE — Discharge Summary (Signed)
Physician Discharge Summary  Robyn Rivera GNF:[id number] DOB: 1976-04-09 DOA: 09/05/2023  PCP: No primary care provider on file.  Admit date: 09/05/2023 Discharge date: 09/09/2023  Admitted From: Home Disposition: Home  Recommendations for Outpatient Follow-up:  Follow up with PCP in 1-2 weeks Please obtain BMP/CBC/lipid profile in one week  Home Health: None Equipment/Devices: None   Discharge Condition: Stable CODE STATUS: Full code Diet recommendation: Cardiac  Brief/Interim Summary:47 year old female history of hypertriglyceridemia induced pancreatitis, hyperlipidemia, now newly diagnosed diabetes mellitus type 2 presents to the ED with sudden onset epigastric and left upper quadrant abdominal pain associated with nausea and emesis. Workup concerning for an acute pancreatitis with lipase level noted elevated at 89, with a transaminitis, fasting lipid panel with a total cholesterol of 981, triglycerides > 5000. Patient admitted, placed on insulin drip and serial triglyceride levels ordered.   Discharge Diagnoses:  Principal Problem:   Acute pancreatitis Active Problems:   Hypertriglyceridemia   Dehydration   Hyponatremia   Transaminitis   New onset type 2 diabetes mellitus (HCC)   Hypercholesterolemia    #1 hypertriglyceridemia induced pancreatitis -Patient admitted with sudden onset epigastric and left upper quadrant abdominal pain with associated nausea and vomiting concerning for acute pancreatitis. -Lipase levels elevated at 89 and patient with a transaminitis with AST of 44, ALT of 53. -Patient states was on Lovaza however has run out for over a year and a half. -Patient denied any ongoing alcohol use. -Lipid panel on presentation with a cholesterol of 981, triglycerides >5000. She was treated with insulin drip and IV fluids.  Triglycerides and LFTs trended down on the day of discharge her triglycerides were 822.  She was anxious and adamant about going home saying  that her baseline triglyceride levels are in the 800 range it will it has never been in the 500 range for the last 17 years of her life.  She was quite anxious to go home since its Thanksgiving and she has family members are visiting from different parts of the country.  She was discharged on Lipitor, Lovaza and Tricor.  Encouraged her to have low-carb diet plenty of exercise and follow-up at the internal medicine clinic.    7.  Newly diagnosed type 2 diabetes mellitus -Hemoglobin A1c 8.4 -metformin 850 mg bid called into pharmacy.   8.  Metabolic acidosis -Improving on bicarb tablets.  Estimated body mass index is 25.31 kg/m as calculated from the following:   Height as of this encounter: 5\' 5"  (1.651 m).   Weight as of this encounter: 69 kg.  Discharge Instructions  Discharge Instructions     Diet - low sodium heart healthy   Complete by: As directed    Increase activity slowly   Complete by: As directed       Allergies as of 09/09/2023       Reactions   Penicillins Other (See Comments)   Patient states "when I was little I almost died"        Medication List     STOP taking these medications    ibuprofen 200 MG tablet Commonly known as: ADVIL       TAKE these medications    acetaminophen 325 MG tablet Commonly known as: TYLENOL Take 2 tablets (650 mg total) by mouth every 6 (six) hours as needed for mild pain (pain score 1-3) (or Fever >/= 101). What changed:  medication strength how much to take when to take this reasons to take this   aspirin EC 81 MG  tablet Take 81 mg by mouth as needed for mild pain (pain score 1-3) or moderate pain (pain score 4-6) (chest pain). Swallow whole.   atorvastatin 80 MG tablet Commonly known as: LIPITOR Take 1 tablet (80 mg total) by mouth daily. Start taking on: September 10, 2023   fenofibrate 160 MG tablet Take 1 tablet (160 mg total) by mouth daily. Start taking on: September 10, 2023   omega-3 acid ethyl esters  1 g capsule Commonly known as: LOVAZA Take 1 capsule (1 g total) by mouth 2 (two) times daily.   ondansetron 4 MG tablet Commonly known as: ZOFRAN Take 1 tablet (4 mg total) by mouth every 6 (six) hours as needed for nausea.   polyethylene glycol 17 g packet Commonly known as: MIRALAX / GLYCOLAX Take 17 g by mouth daily as needed for mild constipation.   senna-docusate 8.6-50 MG tablet Commonly known as: Senokot-S Take 1 tablet by mouth 2 (two) times daily.        Follow-up Information     [facility]. Go on 09/13/2023.   Specialty: Internal Medicine Why: Please go to your PCP appointment scheduled on 09/13/23 at 3:50pm with [facility]. Take you insurance card/letter and list of medications with you. Also arrive 15 minutes prior to your appointment. Contact information: [address] [place] [address] [phone]               Allergies  Allergen Reactions   Penicillins Other (See Comments)    Patient states "when I was little I almost died"    Consultations: none   Procedures/Studies: US Abdomen Limited  Result Date: 09/05/2023 CLINICAL DATA:  [id number] Transaminitis [id number] EXAM: ULTRASOUND ABDOMEN LIMITED RIGHT UPPER QUADRANT COMPARISON:  September 05, 2023 FINDINGS: Gallbladder: Surgically absent. Common bile duct: Diameter: Visualized portion measures 5 mm, within normal limits. Liver: Diffusely increased parenchymal echogenicity with poor acoustic penetrance. There is a near anechoic masslike area noted in the region of the porta hepatis which measures 2.5 x 1.4 x 1.5 cm. This is favored to correspond to the cystic area within the cholecystectomy bed on recent CT and may reflect a cystic duct remnant versus small hepatic cyst. Portal vein is patent on color Doppler imaging with normal direction of blood flow towards the liver. Other: None. IMPRESSION: 1. Hepatic steatosis. Electronically Signed   By:  Meda Klinefelter M.D.   On: 09/05/2023 17:16   CT ABDOMEN PELVIS W CONTRAST  Result Date: 09/05/2023 CLINICAL DATA:  Abdominal pain. Left-sided and radiates to the back. Pain comes and goes. EXAM: CT ABDOMEN AND PELVIS WITH CONTRAST TECHNIQUE: Multidetector CT imaging of the abdomen and pelvis was performed using the standard protocol following bolus administration of intravenous contrast. RADIATION DOSE REDUCTION: This exam was performed according to the departmental dose-optimization program which includes automated exposure control, adjustment of the mA and/or kV according to patient size and/or use of iterative reconstruction technique. CONTRAST:  75mL OMNIPAQUE IOHEXOL 300 MG/ML  SOLN COMPARISON:  07/13/2017 FINDINGS: Lower chest: Dependent atelectasis noted in the lung bases. Hepatobiliary: The liver shows diffusely decreased attenuation suggesting fat deposition. No suspicious focal abnormality within the liver parenchyma. Gallbladder is surgically absent. Small cystic focus in the cholecystectomy bed is stable, potentially dilated cystic duct remnant. No intrahepatic or extrahepatic biliary dilation. Pancreas: Peripancreatic edema is seen in the pancreatic tail region. No ductal dilatation or underlying mass lesion evident. Subtle loss of parenchymal architecture in the tail the  pancreas (27/301) is presumably related to edema. Spleen: No splenomegaly. No suspicious focal mass lesion. Adrenals/Urinary Tract: No adrenal nodule or mass. Kidneys unremarkable. No evidence for hydroureter. The urinary bladder appears normal for the degree of distention. Stomach/Bowel: Stomach is unremarkable. No gastric wall thickening. No evidence of outlet obstruction. Duodenum is normally positioned as is the ligament of Treitz. No small bowel wall thickening. No small bowel dilatation. The terminal ileum is normal. The appendix is normal. No gross colonic mass. No colonic wall thickening. Vascular/Lymphatic: No  abdominal aortic aneurysm. No abdominal aortic atherosclerotic calcification. There is no gastrohepatic or hepatoduodenal ligament lymphadenopathy. No retroperitoneal or mesenteric lymphadenopathy. No pelvic sidewall lymphadenopathy. Reproductive: The uterus is unremarkable. 5.3 cm simple appearing cyst identified right adnexal space. No left adnexal mass. Other: No intraperitoneal free fluid. Musculoskeletal: No worrisome lytic or sclerotic osseous abnormality. IMPRESSION: 1. Peripancreatic edema in the pancreatic tail region compatible with acute pancreatitis. Subtle loss of parenchymal architecture in the tail the pancreas is presumably related to edema. Given fullness in the tail of the pancreas, follow-up recommended to ensure complete resolution. 2. 5.3 cm simple appearing cyst in the right adnexal space. Follow-up pelvic ultrasound recommended in 3-6 months tensure resolution. This recommendation follows ACR consensus guidelines: White Paper of the ACR Incidental Findings Committee II on Adnexal Findings. J Am Coll Radiol [id number]. 3. Hepatic steatosis. Electronically Signed   By: Kennith Center M.D.   On: 09/05/2023 10:42   (Echo, Carotid, EGD, Colonoscopy, ERCP)    Subjective:  anxious to go home Adamant about going home today as she says her baseline triglycerides have been 800 for the last 17 years and it has never been less than that.  Today her levels are 822 Discharge Exam: Vitals:   09/09/23 1109 09/09/23 1200  BP:  (!) 122/55  Pulse:  84  Resp:  19  Temp: 98.5 F (36.9 C)   SpO2:  97%   Vitals:   09/09/23 1000 09/09/23 1100 09/09/23 1109 09/09/23 1200  BP: (!) 103/52   (!) 122/55  Pulse: 81 81  84  Resp: 15 (!) 21  19  Temp:   98.5 F (36.9 C)   TempSrc:   Oral   SpO2: 95% 97%  97%  Weight:      Height:        General: Pt is alert, awake, not in acute distress Cardiovascular: RRR, S1/S2 +, no rubs, no gallops Respiratory: CTA bilaterally, no wheezing, no  rhonchi Abdominal: Soft, NT, ND, bowel sounds + Extremities: no edema, no cyanosis    The results of significant diagnostics from this hospitalization (including imaging, microbiology, ancillary and laboratory) are listed below for reference.     Microbiology: Recent Results (from the past 240 hour(s))  MRSA Next Gen by PCR, Nasal     Status: None   Collection Time: 09/07/23 12:49 AM   Specimen: Nasal Mucosa; Nasal Swab  Result Value Ref Range Status   MRSA by PCR Next Gen NOT DETECTED NOT DETECTED Final    Comment: (NOTE) The GeneXpert MRSA Assay (FDA approved for NASAL specimens only), is one component of a comprehensive MRSA colonization surveillance program. It is not intended to diagnose MRSA infection nor to guide or monitor treatment for MRSA infections. Test performance is not FDA approved in patients less than 50 years old. Performed at [facility], [address] [place], [place] [address]      Labs: BNP (last 3 results) No results for input(s): "BNP" in  the last 8760 hours. Basic Metabolic Panel: Recent Labs  Lab 09/06/23 0320 09/07/23 0607 09/08/23 0259 09/08/23 1034 09/09/23 0251  NA 132* 131* 135 133* 130*  K 3.2* 3.3* 3.9 3.8 3.2*  CL 103 103 103 101 99  CO2 17* 22 23 22  21*  GLUCOSE 98 109* 101* 86 69*  BUN 5* <5* <5* 6 6  CREATININE 0.78 0.61 0.63 0.59 0.71  CALCIUM 8.6* 8.6* 8.9 8.7* 8.3*  MG 1.9 2.2  --   --  2.3  PHOS 2.9  --   --   --   --    Liver Function Tests: Recent Labs  Lab 09/06/23 0320 09/07/23 0607 09/08/23 0259 09/08/23 1034 09/09/23 0251  AST 30 23 26 20 22   ALT 40 31 32 42 40  ALKPHOS 79 78 82 79 81  BILITOT 1.2* 1.9* 1.3* 1.1 0.7  PROT 6.3* 7.0 7.0 7.1 7.1  ALBUMIN 3.3* 3.4* 3.4* 3.5 3.5   Recent Labs  Lab 09/05/23 0609  LIPASE 89*   No results for input(s): "AMMONIA" in the last 168 hours. CBC: Recent Labs  Lab 09/05/23 0609 09/05/23 1112 09/06/23 0802  WBC 10.2  --  9.8  NEUTROABS  7.1  --   --   HGB 13.4 13.3 10.6*  HCT 37.4 39.0 31.1*  MCV 87.2  --  90.7  PLT 180  --  163   Cardiac Enzymes: No results for input(s): "CKTOTAL", "CKMB", "CKMBINDEX", "TROPONINI" in the last 168 hours. BNP: Invalid input(s): "POCBNP" CBG: Recent Labs  Lab 09/08/23 2349 09/09/23 0242 09/09/23 0345 09/09/23 0720 09/09/23 1109  GLUCAP 102* 72 126* 88 215*   D-Dimer No results for input(s): "DDIMER" in the last 72 hours. Hgb A1c No results for input(s): "HGBA1C" in the last 72 hours. Lipid Profile Recent Labs    09/08/23 1034 09/09/23 0251  TRIG 1,085* 822*   Thyroid function studies No results for input(s): "TSH", "T4TOTAL", "T3FREE", "THYROIDAB" in the last 72 hours.  Invalid input(s): "FREET3" Anemia work up No results for input(s): "VITAMINB12", "FOLATE", "FERRITIN", "TIBC", "IRON", "RETICCTPCT" in the last 72 hours. Urinalysis    Component Value Date/Time   COLORURINE YELLOW 09/05/2023 0656   APPEARANCEUR CLEAR 09/05/2023 0656   LABSPEC >=1.030 09/05/2023 0656   PHURINE 5.5 09/05/2023 0656   GLUCOSEU 100 (A) 09/05/2023 0656   HGBUR TRACE (A) 09/05/2023 0656   BILIRUBINUR NEGATIVE 09/05/2023 0656   KETONESUR NEGATIVE 09/05/2023 0656   PROTEINUR NEGATIVE 09/05/2023 0656   NITRITE NEGATIVE 09/05/2023 0656   LEUKOCYTESUR NEGATIVE 09/05/2023 0656   Sepsis Labs Recent Labs  Lab 09/05/23 0609 09/06/23 0802  WBC 10.2 9.8   Microbiology Recent Results (from the past 240 hour(s))  MRSA Next Gen by PCR, Nasal     Status: None   Collection Time: 09/07/23 12:49 AM   Specimen: Nasal Mucosa; Nasal Swab  Result Value Ref Range Status   MRSA by PCR Next Gen NOT DETECTED NOT DETECTED Final    Comment: (NOTE) The GeneXpert MRSA Assay (FDA approved for NASAL specimens only), is one component of a comprehensive MRSA colonization surveillance program. It is not intended to diagnose MRSA infection nor to guide or monitor treatment for MRSA infections. Test  performance is not FDA approved in patients less than 55 years old. Performed at [facility], [address] [place], [place] [address]      Time coordinating discharge: 39 minutes  SIGNED:  Alwyn Ren, MD  [facility] 09/09/2023, 4:41 PM

## 2023-09-09 NOTE — TOC Initial Note (Signed)
Transition of Care [facility]) - Initial/Assessment Note    Patient Details  Name: Robyn Rivera MRN: [id number] Date of Birth: [birth year]  [facility] CM/SW Contact:    Amada Jupiter, LCSW Phone Number: 09/09/2023, 1:50 PM  Clinical Narrative:                 Pt medically cleared for dc home today.  Due to holiday, unable to check/ confirm status of pt's Medicaid.  Pt agreeable with plan for me to contact her in the morning and let her know if she is eligible to be put in [facility] system to cover cost of Lovaza medication.  MD aware of plan as well.  Expected Discharge Plan: Home/Self Care Barriers to Discharge: Barriers Resolved   Patient Goals and CMS Choice Patient states their goals for this hospitalization and ongoing recovery are:: return home CMS Medicare.gov Compare Post Acute Care list provided to:: Patient Choice offered to / list presented to : Patient      Expected Discharge Plan and Services In-house Referral: NA Discharge Planning Services: CM Consult Post Acute Care Choice: NA Living arrangements for the past 2 months: Single Family Home Expected Discharge Date: 09/09/23               DME Arranged: N/A DME Agency: NA       HH Arranged: NA HH Agency: NA        Prior Living Arrangements/Services Living arrangements for the past 2 months: Single Family Home Lives with:: Relatives Patient language and need for interpreter reviewed:: Yes Do you feel safe going back to the place where you live?: Yes      Need for Family Participation in Patient Care: No (Comment) Care giver support system in place?: Yes (comment) Current home services:  (None) Criminal Activity/Legal Involvement Pertinent to Current Situation/Hospitalization: No - Comment as needed  Activities of Daily Living   ADL Screening (condition at time of admission) Independently performs ADLs?: Yes (appropriate for developmental age) Is the patient deaf or have difficulty hearing?:  No Does the patient have difficulty seeing, even when wearing glasses/contacts?: No Does the patient have difficulty concentrating, remembering, or making decisions?: No  Permission Sought/Granted Permission sought to share information with : Case Manager Permission granted to share information with : Yes, Verbal Permission Granted  Share Information with NAME: Case Manager           Emotional Assessment Appearance:: Appears stated age Attitude/Demeanor/Rapport: Gracious Affect (typically observed): Accepting Orientation: : Oriented to Self, Oriented to Place, Oriented to Situation Alcohol / Substance Use: Not Applicable Psych Involvement: No (comment)  Admission diagnosis:  Acute pancreatitis [K85.90] Hypercholesterolemia [E78.00] Hyperglycemia [R73.9] Hypertriglyceridemia [E78.1] Other acute pancreatitis without infection or necrosis [K85.80] Patient Active Problem List   Diagnosis Date Noted   New onset type 2 diabetes mellitus (HCC) 09/06/2023   Hypercholesterolemia 09/06/2023   Acute pancreatitis 09/05/2023   Hypertriglyceridemia 09/05/2023   Dehydration 09/05/2023   Hyponatremia 09/05/2023   Transaminitis 09/05/2023   PCP:  No primary care provider on file. Pharmacy:   [id number] - [place], [place] - [address] AT [facility] [address] [place] [address] Phone: [phone] Fax: [phone]  [id number] [place], [place] - [address] AT [facility] [address] [place] [address] [place] [address] Phone: [phone] Fax: [phone]     Social Determinants of Health (SDOH) Social History: SDOH  Screenings   Food Insecurity: No Food Insecurity (09/05/2023)  Housing: Low Risk  (09/05/2023)  Transportation Needs: No Transportation Needs (09/05/2023)  Utilities: Not At Risk (09/05/2023)  Tobacco Use: Low Risk  (09/05/2023)   SDOH Interventions:     Readmission Risk  Interventions    09/08/2023    3:39 PM  Readmission Risk Prevention Plan  Post Dischage Appt Not Complete  Appt Comments Patient does not have insurance however will assist closer to time of discharge  Medication Screening Complete  Transportation Screening Complete

## 2023-09-13 ENCOUNTER — Encounter: Payer: Self-pay | Admitting: Family Medicine

## 2023-09-13 ENCOUNTER — Ambulatory Visit: Payer: Medicaid Other | Admitting: Family Medicine

## 2023-09-13 VITALS — BP 115/71 | HR 76 | Temp 97.9°F | Resp 18 | Ht 64.0 in | Wt 153.4 lb

## 2023-09-13 DIAGNOSIS — E781 Pure hyperglyceridemia: Secondary | ICD-10-CM | POA: Diagnosis not present

## 2023-09-13 DIAGNOSIS — F419 Anxiety disorder, unspecified: Secondary | ICD-10-CM

## 2023-09-13 DIAGNOSIS — Z0100 Encounter for examination of eyes and vision without abnormal findings: Secondary | ICD-10-CM

## 2023-09-13 DIAGNOSIS — E119 Type 2 diabetes mellitus without complications: Secondary | ICD-10-CM

## 2023-09-13 DIAGNOSIS — Z7689 Persons encountering health services in other specified circumstances: Secondary | ICD-10-CM

## 2023-09-13 DIAGNOSIS — Z09 Encounter for follow-up examination after completed treatment for conditions other than malignant neoplasm: Secondary | ICD-10-CM

## 2023-09-13 DIAGNOSIS — Z1211 Encounter for screening for malignant neoplasm of colon: Secondary | ICD-10-CM | POA: Insufficient documentation

## 2023-09-13 DIAGNOSIS — F32A Depression, unspecified: Secondary | ICD-10-CM

## 2023-09-13 NOTE — Assessment & Plan Note (Signed)
A1C 8.1 on 11/25/2. Referral placed for diabetes nutrition. Taking metformin 850 mg BID. She is going to start going to the gym and work on lifestyle changes to assist with managing diabetes. Recheck A1C around 12/08/23. Will make adjustments to treatment at that time if current medication and lifestyle changes do not improve A1C.

## 2023-09-13 NOTE — Assessment & Plan Note (Signed)
Feels this is related to current situation. No thoughts of self harm. Will continue to monitor and discuss treatment if symptoms do not improve.

## 2023-09-13 NOTE — Assessment & Plan Note (Signed)
Discussed medication compliance as well as monitoring symptoms at home. She is in tune to how it feels when she is developing pancreatitis. Recheck lipid panel around 12/08/23. Continue with current treatment plan. No changes to medications made today.

## 2023-09-13 NOTE — Progress Notes (Signed)
New Patient Office Visit  Subjective    Patient ID: Robyn Rivera, female    DOB: 10-07-1976  Age: 47 y.o. MRN: [id number]  CC:  Chief Complaint  Patient presents with   Establish Care   Follow-up    Hospital follow up    HPI Robyn Rivera presents to establish care with this practice. She is new to me.  She was admitted to the hospital on 11/24 - 11/28 for hypertriglyceridemia induced pancreatis. Her triglycerides were > 5000, total cholesterol 981.  She is taking Lovaza 1 gram BID, Lipitor 80 mg daily,  and fenofibrate 160 mg daily.  09/09/23: triglycerides down to 822.  Abdominal pain has mostly resolved. No nausea or vomiting.   New diagnosis of diabetes with A1C 8.1. Started on metformin 850 mg BID in hospital.  Denies symptoms of  increased thirst and urination. Will get foot exam and urine microalbumin today.   History reviewed. Medications reviewed.  Health Maintenance reviewed. Wishes to defer vaccines  until CPE visit. Reports getting  ill after getting influenza vaccine.  She will schedule CPE with pap in 4 weeks. Recheck lipid panel and A1C around 12/08/23.     Outpatient Encounter Medications as of 09/13/2023  Medication Sig   acetaminophen (TYLENOL) 325 MG tablet Take 2 tablets (650 mg total) by mouth every 6 (six) hours as needed for mild pain (pain score 1-3) (or Fever >/= 101).   atorvastatin (LIPITOR) 80 MG tablet Take 1 tablet (80 mg total) by mouth daily.   fenofibrate 160 MG tablet Take 1 tablet (160 mg total) by mouth daily.   metFORMIN (GLUCOPHAGE) 850 MG tablet Take 850 mg by mouth 2 (two) times daily with a meal.   omega-3 acid ethyl esters (LOVAZA) 1 g capsule Take 1 capsule (1 g total) by mouth 2 (two) times daily.   [DISCONTINUED] aspirin EC 81 MG tablet Take 81 mg by mouth as needed for mild pain (pain score 1-3) or moderate pain (pain score 4-6) (chest pain). Swallow whole.   [DISCONTINUED] ondansetron (ZOFRAN) 4 MG tablet Take 1 tablet (4  mg total) by mouth every 6 (six) hours as needed for nausea.   [DISCONTINUED] polyethylene glycol (MIRALAX / GLYCOLAX) 17 g packet Take 17 g by mouth daily as needed for mild constipation.   [DISCONTINUED] senna-docusate (SENOKOT-S) 8.6-50 MG tablet Take 1 tablet by mouth 2 (two) times daily.   No facility-administered encounter medications on file as of 09/13/2023.    Past Medical History:  Diagnosis Date   Abnormal Pap smear of cervix    Patient had a procedure to cervix- ?LEEP?   History of angina    Hypertriglyceridemia    Pancreatitis     Past Surgical History:  Procedure Laterality Date   CHOLECYSTECTOMY     HERNIA REPAIR     emergency repair surgery following ovarian cystectomy   INCONTINENCE SURGERY     Stress Incontinence surgery per patient   ovarian cystectomy Left 2016    History reviewed. No pertinent family history.  Social History   Socioeconomic History   Marital status: Divorced    Spouse name: Not on file   Number of children: 5   Years of education: 12   Highest education level: Not on file  Occupational History   Not on file  Tobacco Use   Smoking status: Never    Passive exposure: Never   Smokeless tobacco: Never  Vaping Use   Vaping status: Never Used  Substance and Sexual Activity  Alcohol use: No   Drug use: No   Sexual activity: Yes    Partners: Male    Birth control/protection: None  Other Topics Concern   Not on file  Social History Narrative   Not on file   Social Determinants of Health   Financial Resource Strain: Not on file  Food Insecurity: No Food Insecurity (09/05/2023)   Hunger Vital Sign    Worried About Running Out of Food in the Last Year: Never true    Ran Out of Food in the Last Year: Never true  Transportation Needs: No Transportation Needs (09/05/2023)   PRAPARE - Administrator, Civil Service (Medical): No    Lack of Transportation (Non-Medical): No  Physical Activity: Inactive (09/13/2023)    Exercise Vital Sign    Days of Exercise per Week: 0 days    Minutes of Exercise per Session: 0 min  Stress: Not on file  Social Connections: Not on file  Intimate Partner Violence: Not At Risk (09/05/2023)   Humiliation, Afraid, Rape, and Kick questionnaire    Fear of Current or Ex-Partner: No    Emotionally Abused: No    Physically Abused: No    Sexually Abused: No    Review of Systems  Constitutional:  Negative for chills and fever.  Gastrointestinal:  Negative for abdominal pain, constipation, diarrhea, nausea and vomiting.  Endo/Heme/Allergies:  Negative for polydipsia.  Psychiatric/Behavioral:  Positive for depression. Negative for suicidal ideas. The patient is nervous/anxious.         Objective    BP 115/71   Pulse 76   Temp 97.9 F (36.6 C) (Oral)   Resp 18   Ht 5\' 4"  (1.626 m)   Wt 153 lb 6.4 oz (69.6 kg)   LMP 09/09/2023   SpO2 99%   BMI 26.33 kg/m   Physical Exam Vitals and nursing note reviewed.  Constitutional:      General: She is not in acute distress.    Appearance: Normal appearance.  Cardiovascular:     Rate and Rhythm: Normal rate and regular rhythm.     Heart sounds: Normal heart sounds.  Pulmonary:     Effort: Pulmonary effort is normal.     Breath sounds: Normal breath sounds.  Abdominal:     General: Bowel sounds are normal.     Palpations: Abdomen is soft.     Tenderness: There is abdominal tenderness (epigastric, mild). There is no guarding or rebound.  Skin:    General: Skin is warm and dry.  Neurological:     General: No focal deficit present.     Mental Status: She is alert. Mental status is at baseline.  Psychiatric:        Mood and Affect: Mood normal.        Behavior: Behavior normal.        Thought Content: Thought content normal.        Judgment: Judgment normal.    Flowsheet Row Office Visit from 09/13/2023 in [facility]  PHQ-9 Total Score 9         09/13/2023    4:24 PM  GAD 7 :  Generalized Anxiety Score  Nervous, Anxious, on Edge 3  Control/stop worrying 2  Worry too much - different things 1  Trouble relaxing 2  Restless 0  Easily annoyed or irritable 2  Afraid - awful might happen 2  Total GAD 7 Score 12  Will continue to monitor.  Assessment & Plan:   Problem List Items Addressed This Visit     Hypertriglyceridemia    Discussed medication compliance as well as monitoring symptoms at home. She is in tune to how it feels when she is developing pancreatitis. Recheck lipid panel around 12/08/23. Continue with current treatment plan. No changes to medications made today.       New onset type 2 diabetes mellitus (HCC)    A1C 8.1 on 11/25/2. Referral placed for diabetes nutrition. Taking metformin 850 mg BID. She is going to start going to the gym and work on lifestyle changes to assist with managing diabetes. Recheck A1C around 12/08/23. Will make adjustments to treatment at that time if current medication and lifestyle changes do not improve A1C.       Relevant Medications   metFORMIN (GLUCOPHAGE) 850 MG tablet   Other Relevant Orders   Basic metabolic panel   Microalbumin / creatinine urine ratio   Ambulatory referral to Ophthalmology   Referral to Nutrition and Diabetes Services   Encounter for vision screening - Primary    Referral placed due to diabetes status. Feels her vision has worsened.       Relevant Orders   Ambulatory referral to Ophthalmology   Hospital discharge follow-up    Admitted to hospital 11/24- 11/28 for hypertriglyceridemia induced pancreatis. Her triglycerides were > 5000, total cholesterol 981. She is now on Lovaza 1 gram BID, Lipitor 80 mg daily,  and fenofibrate 160 mg daily. Taking these as prescribed.  Triglycerides down to 822 before leaving hospital. Abdominal pain has mostly resolved, continues to have mild pain in epigastric area with palpation. No nausea or vomiting. No fever or chills. Feels much better, getting  energy back.       Relevant Orders   Basic metabolic panel   CBC with Differential/Platelet   Anxiety and depression    Feels this is related to current situation. No thoughts of self harm. Will continue to monitor and discuss treatment if symptoms do not improve.      Agrees with plan of care discussed.  Questions answered.   Return in about 4 weeks (around 10/11/2023) for CPE with labs with pap .   Novella Olive, FNP

## 2023-09-13 NOTE — Assessment & Plan Note (Addendum)
Admitted to hospital 11/24- 11/28 for hypertriglyceridemia induced pancreatis. Her triglycerides were > 5000, total cholesterol 981. She is now on Lovaza 1 gram BID, Lipitor 80 mg daily,  and fenofibrate 160 mg daily. Taking these as prescribed.  Triglycerides down to 822 before leaving hospital. Abdominal pain has mostly resolved, continues to have mild pain in epigastric area with palpation. No nausea or vomiting. No fever or chills. Feels much better, getting energy back.

## 2023-09-13 NOTE — Assessment & Plan Note (Signed)
Referral placed due to diabetes status. Feels her vision has worsened.

## 2023-09-15 LAB — CBC WITH DIFFERENTIAL/PLATELET
Basophils Absolute: 0.1 10*3/uL (ref 0.0–0.2)
Basos: 1 %
EOS (ABSOLUTE): 0.2 10*3/uL (ref 0.0–0.4)
Eos: 3 %
Hematocrit: 32.6 % — ABNORMAL LOW (ref 34.0–46.6)
Hemoglobin: 11.4 g/dL (ref 11.1–15.9)
Immature Grans (Abs): 0.2 10*3/uL — ABNORMAL HIGH (ref 0.0–0.1)
Immature Granulocytes: 3 %
Lymphocytes Absolute: 1.3 10*3/uL (ref 0.7–3.1)
Lymphs: 20 %
MCH: 31.2 pg (ref 26.6–33.0)
MCHC: 35 g/dL (ref 31.5–35.7)
MCV: 89 fL (ref 79–97)
Monocytes Absolute: 0.5 10*3/uL (ref 0.1–0.9)
Monocytes: 8 %
Neutrophils Absolute: 4.6 10*3/uL (ref 1.4–7.0)
Neutrophils: 65 %
Platelets: 289 10*3/uL (ref 150–450)
RBC: 3.65 x10E6/uL — ABNORMAL LOW (ref 3.77–5.28)
RDW: 14.2 % (ref 11.7–15.4)
WBC: 6.8 10*3/uL (ref 3.4–10.8)

## 2023-09-15 LAB — BASIC METABOLIC PANEL
BUN/Creatinine Ratio: 26 — ABNORMAL HIGH (ref 9–23)
BUN: 10 mg/dL (ref 6–24)
CO2: 18 mmol/L — ABNORMAL LOW (ref 20–29)
Calcium: 9.1 mg/dL (ref 8.7–10.2)
Chloride: 100 mmol/L (ref 96–106)
Creatinine, Ser: 0.39 mg/dL — ABNORMAL LOW (ref 0.57–1.00)
Glucose: 133 mg/dL — ABNORMAL HIGH (ref 70–99)
Potassium: 3.9 mmol/L (ref 3.5–5.2)
Sodium: 133 mmol/L — ABNORMAL LOW (ref 134–144)
eGFR: 125 mL/min/{1.73_m2} (ref >59)

## 2023-09-15 LAB — MICROALBUMIN / CREATININE URINE RATIO
Creatinine, Urine: 130.1 mg/dL
Microalb/Creat Ratio: 10 mg/g{creat} (ref 0–29)
Microalbumin, Urine: 12.6 ug/mL

## 2023-10-14 ENCOUNTER — Ambulatory Visit (INDEPENDENT_AMBULATORY_CARE_PROVIDER_SITE_OTHER): Payer: Medicaid Other | Admitting: Family Medicine

## 2023-10-14 ENCOUNTER — Encounter: Payer: Self-pay | Admitting: Family Medicine

## 2023-10-14 VITALS — BP 118/70 | HR 72 | Temp 98.4°F | Resp 16 | Ht 64.0 in | Wt 152.0 lb

## 2023-10-14 DIAGNOSIS — Z789 Other specified health status: Secondary | ICD-10-CM

## 2023-10-14 DIAGNOSIS — E119 Type 2 diabetes mellitus without complications: Secondary | ICD-10-CM

## 2023-10-14 DIAGNOSIS — Z13220 Encounter for screening for lipoid disorders: Secondary | ICD-10-CM

## 2023-10-14 DIAGNOSIS — Z1211 Encounter for screening for malignant neoplasm of colon: Secondary | ICD-10-CM

## 2023-10-14 DIAGNOSIS — Z1329 Encounter for screening for other suspected endocrine disorder: Secondary | ICD-10-CM

## 2023-10-14 DIAGNOSIS — Z13228 Encounter for screening for other metabolic disorders: Secondary | ICD-10-CM

## 2023-10-14 DIAGNOSIS — Z124 Encounter for screening for malignant neoplasm of cervix: Secondary | ICD-10-CM | POA: Diagnosis not present

## 2023-10-14 DIAGNOSIS — Z0000 Encounter for general adult medical examination without abnormal findings: Secondary | ICD-10-CM

## 2023-10-14 DIAGNOSIS — Z1159 Encounter for screening for other viral diseases: Secondary | ICD-10-CM

## 2023-10-14 DIAGNOSIS — Z136 Encounter for screening for cardiovascular disorders: Secondary | ICD-10-CM

## 2023-10-14 NOTE — Progress Notes (Signed)
 Complete physical exam  Patient: Robyn Rivera   DOB: 1975-12-08   48 y.o. Female  MRN: [id number]  Subjective:    Chief Complaint  Patient presents with   Annual Exam    Fasting labs    Robyn Rivera is a 48 y.o. female who presents today for a complete physical exam. She reports consuming a  low red meat,  low sugar  diet. Gym/ health club routine includes cardio and mod to heavy weightlifting. She generally feels well. She reports sleeping well. She does not have additional problems to discuss today.    Most recent fall risk assessment:    09/13/2023    4:24 PM  Fall Risk   Falls in the past year? 0  Number falls in past yr: 0  Injury with Fall? 0  Risk for fall due to : No Fall Risks  Follow up Falls evaluation completed     Most recent depression screenings:    09/13/2023    4:24 PM  PHQ 2/9 Scores  PHQ - 2 Score 4  PHQ- 9 Score 9    Vision:Within last year and Dental: No current dental problems and No regular dental care   Past Medical History:  Diagnosis Date   Abnormal Pap smear of cervix    Patient had a procedure to cervix- ?LEEP?   History of angina    Hypertriglyceridemia    Pancreatitis    Type 2 diabetes mellitus [facility])       Patient Care Team: Booker Darice SAUNDERS, FNP as PCP - General (Family Medicine)   Outpatient Medications Prior to Visit  Medication Sig   acetaminophen  (TYLENOL ) 325 MG tablet Take 2 tablets (650 mg total) by mouth every 6 (six) hours as needed for mild pain (pain score 1-3) (or Fever >/= 101).   atorvastatin  (LIPITOR ) 80 MG tablet Take 1 tablet (80 mg total) by mouth daily.   fenofibrate  160 MG tablet Take 1 tablet (160 mg total) by mouth daily.   metFORMIN  (GLUCOPHAGE ) 850 MG tablet Take 850 mg by mouth 2 (two) times daily with a meal.   omega-3 acid ethyl esters (LOVAZA ) 1 g capsule Take 1 capsule (1 g total) by mouth 2 (two) times daily.   No facility-administered medications prior to visit.    ROS         Objective:     BP 118/70 (BP Location: Right Arm, Patient Position: Sitting, Cuff Size: Normal)   Pulse 72   Temp 98.4 F (36.9 C) (Oral)   Resp 16   Ht 5' 4 (1.626 m)   Wt 152 lb (68.9 kg)   LMP 08/13/2023 (Approximate)   SpO2 99%   BMI 26.09 kg/m  BP Readings from Last 3 Encounters:  10/14/23 118/70  09/13/23 115/71  09/09/23 (!) 122/55      Physical Exam Vitals and nursing note reviewed. Exam conducted with a chaperone present.  Constitutional:      General: She is not in acute distress.    Appearance: Normal appearance.  HENT:     Right Ear: Tympanic membrane normal.     Left Ear: Tympanic membrane normal.     Nose: Nose normal.     Mouth/Throat:     Mouth: Mucous membranes are moist.     Pharynx: Oropharynx is clear.  Eyes:     Extraocular Movements: Extraocular movements intact.  Neck:     Thyroid: No thyroid tenderness.  Cardiovascular:     Rate and Rhythm: Normal rate and  regular rhythm.     Pulses:          Radial pulses are 2+ on the right side and 2+ on the left side.     Heart sounds: Normal heart sounds, S1 normal and S2 normal.  Pulmonary:     Effort: Pulmonary effort is normal.     Breath sounds: Normal breath sounds.  Chest:  Breasts:    Right: Normal. No bleeding, inverted nipple, mass, nipple discharge, skin change or tenderness.     Left: Normal. No bleeding, inverted nipple, mass, nipple discharge, skin change or tenderness.  Abdominal:     General: Bowel sounds are normal.     Palpations: Abdomen is soft.     Tenderness: There is no abdominal tenderness.  Genitourinary:    General: Normal vulva.     Exam position: Lithotomy position.     Vagina: Normal.     Cervix: Normal.     Adnexa: Right adnexa normal and left adnexa normal.  Musculoskeletal:        General: Normal range of motion.     Cervical back: Normal range of motion.     Right lower leg: No edema.     Left lower leg: No edema.  Lymphadenopathy:     Cervical:     Right  cervical: No superficial cervical adenopathy.    Left cervical: No superficial cervical adenopathy.     Upper Body:     Right upper body: No supraclavicular or axillary adenopathy.     Left upper body: No supraclavicular or axillary adenopathy.  Skin:    General: Skin is warm and dry.  Neurological:     General: No focal deficit present.     Mental Status: She is alert. Mental status is at baseline.  Psychiatric:        Mood and Affect: Mood normal.        Behavior: Behavior normal.        Thought Content: Thought content normal.        Judgment: Judgment normal.      No results found for any visits on 10/14/23.     Assessment & Plan:    Routine Health Maintenance and Physical Exam   There is no immunization history on file for this patient.  Health Maintenance  Topic Date Due   OPHTHALMOLOGY EXAM  Never done   DTaP/Tdap/Td (1 - Tdap) Never done   Cervical Cancer Screening (HPV/Pap Cotest)  01/27/2022   COVID-19 Vaccine (1 - 2024-25 season) Never done   Colonoscopy  Never done   INFLUENZA VACCINE  01/10/2024 (Originally 05/13/2023)   HEMOGLOBIN A1C  03/05/2024   Diabetic kidney evaluation - eGFR measurement  09/12/2024   Diabetic kidney evaluation - Urine ACR  09/12/2024   FOOT EXAM  10/13/2024   Hepatitis C Screening  Completed   HIV Screening  Completed   HPV VACCINES  Aged Out    Discussed health benefits of physical activity, and encouraged her to engage in regular exercise appropriate for her age and condition.  Annual physical exam -     CBC with Differential/Platelet -     Comprehensive metabolic panel  Screening for cervical cancer -     IGP, Aptima HPV  Encounter for lipid screening for cardiovascular disease -     Lipid panel  Encounter for screening for metabolic disorder -     Comprehensive metabolic panel -     Hemoglobin A1c  Screening for thyroid disorder -  TSH + free T4  Diabetes mellitus without complication (HCC) -     Hemoglobin  A1c -     Lipid panel -     Microalbumin / creatinine urine ratio  Need for hepatitis C screening test -     Hepatitis C antibody  Screening for viral disease -     HIV Antibody (routine testing w rflx)  Screening for colon cancer -     Ambulatory referral to Gastroenterology  Uses contraceptive implant for birth control -     Ambulatory referral to Gynecology      Routine labs ordered.  HCM reviewed/discussed. Pap completed today. Hep C and HIV ordered. Declines influenza vaccine. Will be getting eye exam soon. Anticipatory guidance regarding healthy weight, lifestyle and choices given. Recommend healthy diet.  Recommend approximately 150 minutes/week of moderate intensity exercise. Resistance training is good for building muscles and for bone health. Muscle mass helps to increase our metabolism and to burn more calories at rest.  Limit alcohol consumption: no more than one drink per day for women and 2 drinks per day for me. Recommend regular dental and vision exams. Always use seatbelt/lap and shoulder restraints. Recommend using smoke alarms and checking batteries at least twice a year. Recommend using sunscreen when outside.  Please know that I am here to help you with all of your health care goals and am happy to work with you to find a solution that works best for you.  The greatest advice I have received with any changes in life are to take it one step at a time, that even means if all you can focus on is the next 60 seconds, then do that and celebrate your victories.  With any changes in life, you will have set backs, and that is OK. The important thing to remember is, if you have a set back, it is not a failure, it is an opportunity to try again! Agrees with plan of care discussed.  Questions answered.      Return in about 3 months (around 01/12/2024) for DM.     Darice JONELLE Brownie, FNP

## 2023-10-15 ENCOUNTER — Other Ambulatory Visit: Payer: Self-pay | Admitting: Family Medicine

## 2023-10-15 LAB — CBC WITH DIFFERENTIAL/PLATELET
Basophils Absolute: 0 10*3/uL (ref 0.0–0.2)
Basos: 1 %
EOS (ABSOLUTE): 0.1 10*3/uL (ref 0.0–0.4)
Eos: 2 %
Hematocrit: 36.2 % (ref 34.0–46.6)
Hemoglobin: 12.7 g/dL (ref 11.1–15.9)
Immature Grans (Abs): 0 10*3/uL (ref 0.0–0.1)
Immature Granulocytes: 1 %
Lymphocytes Absolute: 1.4 10*3/uL (ref 0.7–3.1)
Lymphs: 25 %
MCH: 31.2 pg (ref 26.6–33.0)
MCHC: 35.1 g/dL (ref 31.5–35.7)
MCV: 89 fL (ref 79–97)
Monocytes Absolute: 0.3 10*3/uL (ref 0.1–0.9)
Monocytes: 6 %
Neutrophils Absolute: 3.7 10*3/uL (ref 1.4–7.0)
Neutrophils: 65 %
Platelets: 170 10*3/uL (ref 150–450)
RBC: 4.07 x10E6/uL (ref 3.77–5.28)
RDW: 15 % (ref 11.7–15.4)
WBC: 5.6 10*3/uL (ref 3.4–10.8)

## 2023-10-15 LAB — COMPREHENSIVE METABOLIC PANEL
ALT: 52 [IU]/L — ABNORMAL HIGH (ref 0–32)
AST: 72 [IU]/L — ABNORMAL HIGH (ref 0–40)
Albumin: 4.4 g/dL (ref 3.9–4.9)
Alkaline Phosphatase: 145 [IU]/L — ABNORMAL HIGH (ref 44–121)
BUN/Creatinine Ratio: 17 (ref 9–23)
BUN: 13 mg/dL (ref 6–24)
Bilirubin Total: 0.9 mg/dL (ref 0.0–1.2)
CO2: 19 mmol/L — ABNORMAL LOW (ref 20–29)
Calcium: 8.8 mg/dL (ref 8.7–10.2)
Chloride: 101 mmol/L (ref 96–106)
Creatinine, Ser: 0.76 mg/dL (ref 0.57–1.00)
Globulin, Total: 2.4 g/dL (ref 1.5–4.5)
Glucose: 154 mg/dL — ABNORMAL HIGH (ref 70–99)
Potassium: 4 mmol/L (ref 3.5–5.2)
Sodium: 135 mmol/L (ref 134–144)
Total Protein: 6.8 g/dL (ref 6.0–8.5)
eGFR: 98 mL/min/{1.73_m2} (ref >59)

## 2023-10-15 LAB — MICROALBUMIN / CREATININE URINE RATIO
Creatinine, Urine: 214.7 mg/dL
Microalb/Creat Ratio: 7 mg/g{creat} (ref 0–29)
Microalbumin, Urine: 14 ug/mL

## 2023-10-15 LAB — HIV ANTIBODY (ROUTINE TESTING W REFLEX): HIV Screen 4th Generation wRfx: NONREACTIVE

## 2023-10-15 LAB — HEMOGLOBIN A1C
Est. average glucose Bld gHb Est-mCnc: 148 mg/dL
Hgb A1c MFr Bld: 6.8 % — ABNORMAL HIGH (ref 4.8–5.6)

## 2023-10-15 LAB — LIPID PANEL
Chol/HDL Ratio: 12.6 {ratio} — ABNORMAL HIGH (ref 0.0–4.4)
Cholesterol, Total: 226 mg/dL — ABNORMAL HIGH (ref 100–199)
HDL: 18 mg/dL — ABNORMAL LOW (ref >39)
Triglycerides: 1791 mg/dL (ref 0–149)

## 2023-10-15 LAB — HEPATITIS C ANTIBODY: Hep C Virus Ab: NONREACTIVE

## 2023-10-15 LAB — TSH+FREE T4
Free T4: 1.02 ng/dL (ref 0.82–1.77)
TSH: 2.59 u[IU]/mL (ref 0.450–4.500)

## 2023-10-15 MED ORDER — OMEGA-3-ACID ETHYL ESTERS 1 G PO CAPS: 2.0000 g | ORAL_CAPSULE | Freq: Two times a day (BID) | ORAL | 4 refills | Status: DC

## 2023-10-15 NOTE — Progress Notes (Signed)
 Labs reviewed per telephone.

## 2023-10-20 ENCOUNTER — Other Ambulatory Visit: Payer: Self-pay | Admitting: Family Medicine

## 2023-10-20 MED ORDER — OMEGA-3-ACID ETHYL ESTERS 1 G PO CAPS: 2.0000 g | ORAL_CAPSULE | Freq: Two times a day (BID) | ORAL | 4 refills | Status: DC

## 2023-10-22 LAB — IGP, APTIMA HPV
HPV Aptima: NEGATIVE
PAP Smear Comment: 0

## 2023-11-02 ENCOUNTER — Ambulatory Visit: Payer: Self-pay | Admitting: Skilled Nursing Facility1

## 2023-12-03 ENCOUNTER — Encounter: Payer: Self-pay | Admitting: Gastroenterology

## 2023-12-06 ENCOUNTER — Ambulatory Visit: Payer: Self-pay | Admitting: Skilled Nursing Facility1

## 2023-12-07 ENCOUNTER — Encounter: Payer: Self-pay | Admitting: Radiology

## 2023-12-07 ENCOUNTER — Ambulatory Visit: Payer: Medicaid Other | Admitting: Radiology

## 2023-12-07 VITALS — BP 126/84 | HR 63 | Ht 61.75 in | Wt 153.8 lb

## 2023-12-07 DIAGNOSIS — Z3046 Encounter for surveillance of implantable subdermal contraceptive: Secondary | ICD-10-CM

## 2023-12-07 DIAGNOSIS — N3946 Mixed incontinence: Secondary | ICD-10-CM | POA: Diagnosis not present

## 2023-12-07 NOTE — Progress Notes (Signed)
   Robyn Rivera Oct 01, 1976 [id number]   History:  48 y.o. G5P5  here to discuss Nexplanon removal. Does not desire other BC option, will use condoms. Patient unsure when inserted. Care Everywhere note states it was due to be replaced 07/2020. Last AEX 10/14/23 with PCP. P 10/14/23 ASCUS, HPV negative Hx LEEP 2018 per patient, in [place].  She also reports stress and urge incontinence, has completed physical therapy (only a few treatments with no improvement so she stopped). She reports having surgery in 2016 for an ovarian cyst and they 'put a piece of plastic in there so I don't leak but it stopped working'. I could not find records for this, will request those.   Gynecologic History Patient's last menstrual period was 11/30/2023 (approximate). Period Cycle (Days): 28 Period Duration (Days): 5 Period Pattern: Regular Menstrual Flow: Moderate Menstrual Control: Tampon Dysmenorrhea: (!) Moderate Dysmenorrhea Symptoms: Cramping Contraception/Family planning: condoms Sexually active: yes Last Pap: 10/2023. Results were: ASCUS HPV negative, hx of LEEP 2018  Obstetric History OB History  Gravida Para Term Preterm AB Living  5 5 5  0 0 5  SAB IAB Ectopic Multiple Live Births  0 0 0 0 5    # Outcome Date GA Lbr Len/2nd Weight Sex Type Anes PTL Lv  5 Term           4 Term           3 Term           2 Term           1 Term                09/13/2023    4:24 PM  Depression screen PHQ 2/9  Decreased Interest 2  Down, Depressed, Hopeless 2  PHQ - 2 Score 4  Altered sleeping 1  Tired, decreased energy 3  Change in appetite 0  Feeling bad or failure about yourself  0  Trouble concentrating 0  Moving slowly or fidgety/restless 1  Suicidal thoughts 0  PHQ-9 Score 9     The following portions of the patient's history were reviewed and updated as appropriate: allergies, current medications, past family history, past medical history, past social history, past surgical history, and  problem list.  ROS  Past medical history, past surgical history, family history and social history were all reviewed and documented in the EPIC chart.  Exam:  Vitals:   12/07/23 1416  BP: 126/84  Pulse: 63  SpO2: 96%  Weight: 153 lb 12.8 oz (69.8 kg)  Height: 5' 1.75" (1.568 m)   Body mass index is 28.36 kg/m.  Physical Exam Constitutional:      Appearance: Normal appearance. She is normal weight.  Pulmonary:     Effort: Pulmonary effort is normal.  Neurological:     Mental Status: She is alert.  Psychiatric:        Mood and Affect: Mood normal.        Thought Content: Thought content normal.        Judgment: Judgment normal.     Raynelle Fanning, CMA present for exam  Assessment/Plan:   1. Encounter for Nexplanon removal (Primary) - Removal of implanon rod; Future  2. Mixed stress and urge incontinence - Ambulatory referral to Physical Therapy      Return for nexplanon removal.  Terel Bann B WHNP-BC 2:53 PM 12/07/2023

## 2023-12-17 ENCOUNTER — Ambulatory Visit: Payer: Medicaid Other

## 2023-12-17 VITALS — Ht 64.0 in | Wt 154.0 lb

## 2023-12-17 DIAGNOSIS — Z1211 Encounter for screening for malignant neoplasm of colon: Secondary | ICD-10-CM

## 2023-12-17 MED ORDER — SUFLAVE 178.7 G PO SOLR: 1.0000 | Freq: Once | ORAL | 0 refills | Status: AC

## 2023-12-17 NOTE — Progress Notes (Signed)
No egg or soy allergy known to patient   No issues known to pt with past sedation with any surgeries or procedures  Patient denies ever being told they had issues or difficulty with intubation   No FH of Malignant Hyperthermia  Pt is not on diet pills  Pt is not on  home 02   Pt is not on blood thinners   Pt denies issues with constipation   No A fib or A flutter  Have any cardiac testing pending--no Pt instructed to use Singlecare.com or GoodRx for a price reduction on prep     Patient's chart reviewed by John Nulty CRNA prior to previsit and patient appropriate for the [facility].  Previsit completed and red dot placed by patient's name on their procedure day (on provider's schedule).    

## 2023-12-31 ENCOUNTER — Ambulatory Visit: Payer: Medicaid Other | Admitting: Radiology

## 2023-12-31 DIAGNOSIS — Z3046 Encounter for surveillance of implantable subdermal contraceptive: Secondary | ICD-10-CM

## 2023-12-31 NOTE — Progress Notes (Signed)
 48 y.o. [id number] female presents for Nexplanon removal.  Reason for removal overdue.  She has decided to use condoms for future contraception.  Procedure, risks and benefits have all been explained.   After all questions were answered, consent was obtained.    Past Medical History:  Diagnosis Date   Abnormal Pap smear of cervix    Patient had a procedure to cervix- ?LEEP?   Anxiety    Blood transfusion without reported diagnosis    History of angina    Hypertriglyceridemia    Pancreatitis    Type 2 diabetes mellitus (HCC)     Past Surgical History:  Procedure Laterality Date   CHOLECYSTECTOMY     HERNIA REPAIR     emergency repair surgery following ovarian cystectomy   INCONTINENCE SURGERY     Stress Incontinence surgery per patient   LEEP     2018 in [place] per patient.   ovarian cystectomy Left 2016   UPPER GASTROINTESTINAL ENDOSCOPY      Current Outpatient Medications on File Prior to Visit  Medication Sig Dispense Refill   acetaminophen (TYLENOL) 325 MG tablet Take 2 tablets (650 mg total) by mouth every 6 (six) hours as needed for mild pain (pain score 1-3) (or Fever >/= 101).     atorvastatin (LIPITOR) 80 MG tablet Take 1 tablet (80 mg total) by mouth daily. 30 tablet 4   etonogestrel (NEXPLANON) 68 MG IMPL implant 1 each by Subdermal route once. Left arm---patient unsure when inserted     ibuprofen (ADVIL) 200 MG tablet Take 200 mg by mouth every 6 (six) hours as needed.     metFORMIN (GLUCOPHAGE) 850 MG tablet Take 850 mg by mouth 2 (two) times daily with a meal.     omega-3 acid ethyl esters (LOVAZA) 1 g capsule Take 2 capsules (2 g total) by mouth 2 (two) times daily. 60 capsule 4   SUFLAVE 178.7 g SOLR Take by mouth.     fenofibrate 160 MG tablet Take 1 tablet (160 mg total) by mouth daily. (Patient not taking: Reported on 12/07/2023) 30 tablet 4   No current facility-administered medications on file prior to visit.   Allergies  Allergen Reactions    Penicillins Other (See Comments)    Patient states "when I was little I almost died"    Vitals:   January 03, 2024 1339  BP: 120/76   Physical Exam Vitals reviewed. Exam conducted with a chaperone present.  Constitutional:      Appearance: Normal appearance. She is normal weight.  Pulmonary:     Effort: Pulmonary effort is normal.  Neurological:     Mental Status: She is alert.  Psychiatric:        Mood and Affect: Mood normal.        Thought Content: Thought content normal.        Judgment: Judgment normal.      Time out performed and informed consent received.  Procedure: Patient placed supine on exam table with her left arm flexed at the elbow. The prior insertion site was located and the Nexplanon rod was palpated.  Area cleansed with Betadine x 3 and draped in normal sterile fashion.  Insertion site and surrounding tissue anesthetized with 1% Lidocaine without epinephrine, 2cc total used.  Small incision made with #11 blade.  Nexplanon removed without difficulty.  Steri-strips were applied and pressure dressing placed over the site.  Entire procedure performed with sterile technique.  Pt tolerated procedure well. Chaperone, Raynelle Fanning, CMA was present  for the entirety of the procedure  Assessment/Plan:   1. Encounter for Nexplanon removal - Removal of implanon rod    Post procedure instructions reviewed with pt.  Questions answered.  Pt knows to call with any concerns or questions.  New contraception:  condoms   [place], [facility]

## 2024-01-10 ENCOUNTER — Encounter: Payer: Medicaid Other | Admitting: Gastroenterology

## 2024-01-12 ENCOUNTER — Ambulatory Visit: Payer: Medicaid Other | Admitting: Family Medicine

## 2024-01-14 ENCOUNTER — Encounter: Payer: Self-pay | Admitting: Family Medicine

## 2024-01-14 ENCOUNTER — Ambulatory Visit: Admitting: Family Medicine

## 2024-01-14 ENCOUNTER — Ambulatory Visit

## 2024-01-14 VITALS — BP 130/82 | HR 66 | Temp 98.6°F | Ht 64.0 in | Wt 149.0 lb

## 2024-01-14 DIAGNOSIS — Z1211 Encounter for screening for malignant neoplasm of colon: Secondary | ICD-10-CM

## 2024-01-14 DIAGNOSIS — M7731 Calcaneal spur, right foot: Secondary | ICD-10-CM

## 2024-01-14 DIAGNOSIS — M79671 Pain in right foot: Secondary | ICD-10-CM | POA: Diagnosis not present

## 2024-01-14 DIAGNOSIS — E119 Type 2 diabetes mellitus without complications: Secondary | ICD-10-CM | POA: Diagnosis not present

## 2024-01-14 DIAGNOSIS — E781 Pure hyperglyceridemia: Secondary | ICD-10-CM

## 2024-01-14 DIAGNOSIS — K439 Ventral hernia without obstruction or gangrene: Secondary | ICD-10-CM | POA: Diagnosis not present

## 2024-01-14 MED ORDER — METFORMIN HCL 850 MG PO TABS: 850.0000 mg | ORAL_TABLET | Freq: Two times a day (BID) | ORAL | 0 refills | Status: DC

## 2024-01-14 MED ORDER — LANCETS MISC. MISC: 1.0000 | Freq: Three times a day (TID) | 0 refills | Status: DC

## 2024-01-14 MED ORDER — LANCET DEVICE MISC: 1.0000 | Freq: Three times a day (TID) | 0 refills | Status: DC

## 2024-01-14 MED ORDER — BLOOD GLUCOSE MONITORING SUPPL DEVI: 1.0000 | Freq: Three times a day (TID) | 0 refills | Status: AC

## 2024-01-14 MED ORDER — OMEGA-3-ACID ETHYL ESTERS 1 G PO CAPS: 2.0000 g | ORAL_CAPSULE | Freq: Two times a day (BID) | ORAL | 0 refills | Status: DC

## 2024-01-14 MED ORDER — BLOOD GLUCOSE TEST VI STRP: 1.0000 | ORAL_STRIP | Freq: Three times a day (TID) | 0 refills | Status: AC

## 2024-01-14 NOTE — Patient Instructions (Signed)
 [facility]  [address]  The radiology department is on the first floor which is best accessed by going around to the back of the building. No appointment necessary. You can go at your convenience.

## 2024-01-14 NOTE — Assessment & Plan Note (Signed)
 Has been diagnosed of RLQ hernia in the past. Unable to appreciate this upon exam today. Limited abdominal ultrasound ordered for evaluation. Will send to general surgery if needed.  No erythema noted on abdomen upon exam.

## 2024-01-14 NOTE — Assessment & Plan Note (Signed)
 Unable to tolerate prep for colonoscopy. No family history of colon cancer. Cologuard ordered.

## 2024-01-14 NOTE — Assessment & Plan Note (Signed)
 Recheck lipid panel today. Taking Lovaza 2 grams BID for high triglycerides. No abdominal symptoms today. Not taking fenofibrate currently per report.

## 2024-01-14 NOTE — Progress Notes (Signed)
 Established Patient Office Visit  Subjective   Patient ID: Robyn Rivera, female    DOB: [birth year]  Age: 48 y.o. MRN: [id number]  Chief Complaint  Patient presents with   Diabetes    3 month follow up    HPI  Diabetes: Medication compliance: Taking metformin 850 mg BID as prescribed.  Denies chest pain, shortness of breath, vision changes, polydipsia, polyphagia, polyuria. Denies hypoglycemia.  Pertinent lab work: A1C: 10/14/23: A1C 6.8 (down from 8.1) Monitoring: blood sugar readings at home: does not check at home.           Continue current medication regimen: no change today, will check at home  Well controlled: will check A1C today  Follow-up: 3 months  Has not made eye appointment yet, does not have a car Will order glucose testing.   Right foot pain: hurts on bottom and feels warm all the time.  Affecting sleep.  Symptoms present for 3 weeks. Pain is getting worse.  Hurts to walk.  Elevated triglycerides:  Has been hospitalized for pancreatitis due to extremely high levels.  No symptoms present today. Recheck lipid panel.  History of hernia repair:  Has had 3 repaired in the past. Right sided hernia causing pain.     ROS    Objective:     BP 130/82 (BP Location: Right Arm, Patient Position: Sitting, Cuff Size: Normal)   Pulse 66   Temp 98.6 F (37 C) (Oral)   Ht 5\' 4"  (1.626 m)   Wt 149 lb (67.6 kg)   LMP 01/05/2024 (Approximate)   SpO2 98%   BMI 25.58 kg/m  BP Readings from Last 3 Encounters:  01/14/24 130/82  12/31/23 120/76  12/07/23 126/84      Physical Exam Vitals and nursing note reviewed.  Constitutional:      General: She is not in acute distress.    Appearance: Normal appearance.  Cardiovascular:     Rate and Rhythm: Regular rhythm.     Heart sounds: Normal heart sounds.  Pulmonary:     Effort: Pulmonary effort is normal.     Breath sounds: Normal breath sounds.  Feet:     Right foot:     Skin integrity: Skin  integrity normal.     Toenail Condition: Right toenails are normal.     Comments: Right foot with normal appearance. Sensation intact. Pedal pulse intact. Hair growth present. Pain to palpation on plantar surface near 3rd metatarsal.  No erythema. No reported injury.  Skin:    General: Skin is warm and dry.  Neurological:     General: No focal deficit present.     Mental Status: She is alert. Mental status is at baseline.  Psychiatric:        Mood and Affect: Mood normal.        Behavior: Behavior normal.        Thought Content: Thought content normal.        Judgment: Judgment normal.     No results found for any visits on 01/14/24.  Last hemoglobin A1c Lab Results  Component Value Date   HGBA1C 6.8 (H) 10/14/2023      The ASCVD Risk score (Arnett DK, et al., 2019) failed to calculate for the following reasons:   Risk score cannot be calculated because patient has a medical history suggesting prior/existing ASCVD    Assessment & Plan:   Problem List Items Addressed This Visit     Hypertriglyceridemia   Recheck lipid panel today. Taking Lovaza  2 grams BID for high triglycerides. No abdominal symptoms today. Not taking fenofibrate currently per report.       Relevant Medications   omega-3 acid ethyl esters (LOVAZA) 1 g capsule   Other Relevant Orders   Lipid panel   Diabetes mellitus without complication (HCC) - Primary   Taking metformin 850 mg BID as prescribed.  Denies chest pain, shortness of breath, vision changes, polydipsia, polyphagia, polyuria. Denies hypoglycemia.  Pertinent lab work: A1C: 10/14/23: A1C 6.8 (down from 8.1) Recheck A1C today. Refills sent. Follow-up in 3 months.  Foot exam up to date. Needs eye exam, referral placed at earlier visit, needs to schedule appointment soon. Order sent for glucose monitoring kit per request.       Relevant Medications   metFORMIN (GLUCOPHAGE) 850 MG tablet   Blood Glucose Monitoring Suppl DEVI    Glucose Blood (BLOOD GLUCOSE TEST STRIPS) STRP   Lancet Device MISC   Lancets Misc. MISC   Other Relevant Orders   Hemoglobin A1c   Comprehensive Metabolic Panel (CMET)   Screening for colon cancer   Unable to tolerate prep for colonoscopy. No family history of colon cancer. Cologuard ordered.       Relevant Orders   Cologuard   Right foot pain   No injury reported. Tenderness to palpation on plantar surface of right foot. Right foot x-ray ordered for evaluation. Next steps based on results. Ambulating well on foot.       Relevant Orders   DG Foot Complete Right   Hernia of abdominal wall   Has been diagnosed of RLQ hernia in the past. Unable to appreciate this upon exam today. Limited abdominal ultrasound ordered for evaluation. Will send to general surgery if needed.  No erythema noted on abdomen upon exam.       Relevant Orders   US Abdomen Limited  Agrees with plan of care discussed.  Questions answered.   Return in about 3 months (around 04/14/2024) for DM, high triglycerides .    Novella Olive, FNP

## 2024-01-14 NOTE — Assessment & Plan Note (Signed)
 No injury reported. Tenderness to palpation on plantar surface of right foot. Right foot x-ray ordered for evaluation. Next steps based on results. Ambulating well on foot.

## 2024-01-14 NOTE — Assessment & Plan Note (Addendum)
 Taking metformin 850 mg BID as prescribed.  Denies chest pain, shortness of breath, vision changes, polydipsia, polyphagia, polyuria. Denies hypoglycemia.  Pertinent lab work: A1C: 10/14/23: A1C 6.8 (down from 8.1) Recheck A1C today. Refills sent. Follow-up in 3 months.  Foot exam up to date. Needs eye exam, referral placed at earlier visit, needs to schedule appointment soon. Order sent for glucose monitoring kit per request.

## 2024-01-15 LAB — LIPID PANEL
Chol/HDL Ratio: 67.4 ratio — ABNORMAL HIGH (ref 0.0–4.4)
Cholesterol, Total: 741 mg/dL (ref 100–199)
HDL: 11 mg/dL — ABNORMAL LOW (ref >39)
Triglycerides: 6757 mg/dL (ref 0–149)

## 2024-01-15 LAB — COMPREHENSIVE METABOLIC PANEL WITH GFR
ALT: 83 IU/L — ABNORMAL HIGH (ref 0–32)
AST: 79 IU/L — ABNORMAL HIGH (ref 0–40)
Alkaline Phosphatase: 129 IU/L — ABNORMAL HIGH (ref 44–121)
BUN/Creatinine Ratio: 16 (ref 9–23)
BUN: 10 mg/dL (ref 6–24)
Bilirubin Total: 0.8 mg/dL (ref 0.0–1.2)
CO2: 17 mmol/L — ABNORMAL LOW (ref 20–29)
Calcium: 9.3 mg/dL (ref 8.7–10.2)
Chloride: 89 mmol/L — ABNORMAL LOW (ref 96–106)
Creatinine, Ser: 0.61 mg/dL (ref 0.57–1.00)
Glucose: 229 mg/dL — ABNORMAL HIGH (ref 70–99)
Potassium: 3.8 mmol/L (ref 3.5–5.2)
Sodium: 126 mmol/L — ABNORMAL LOW (ref 134–144)
Total Protein: 6.8 g/dL (ref 6.0–8.5)
eGFR: 112 mL/min/{1.73_m2} (ref >59)

## 2024-01-15 LAB — HEMOGLOBIN A1C
Est. average glucose Bld gHb Est-mCnc: 235 mg/dL
Hgb A1c MFr Bld: 9.8 % — ABNORMAL HIGH (ref 4.8–5.6)

## 2024-01-16 ENCOUNTER — Encounter: Payer: Self-pay | Admitting: Family Medicine

## 2024-01-16 ENCOUNTER — Other Ambulatory Visit: Payer: Self-pay | Admitting: Family Medicine

## 2024-01-16 DIAGNOSIS — E0865 Diabetes mellitus due to underlying condition with hyperglycemia: Secondary | ICD-10-CM

## 2024-01-16 DIAGNOSIS — E119 Type 2 diabetes mellitus without complications: Secondary | ICD-10-CM

## 2024-01-16 DIAGNOSIS — E7800 Pure hypercholesterolemia, unspecified: Secondary | ICD-10-CM

## 2024-01-16 DIAGNOSIS — E781 Pure hyperglyceridemia: Secondary | ICD-10-CM

## 2024-01-16 MED ORDER — GLIPIZIDE 5 MG PO TABS: 5.0000 mg | ORAL_TABLET | Freq: Every day | ORAL | 0 refills | Status: DC

## 2024-01-16 MED ORDER — FENOFIBRATE 160 MG PO TABS: 160.0000 mg | ORAL_TABLET | Freq: Every day | ORAL | 1 refills | Status: DC

## 2024-01-16 MED ORDER — ATORVASTATIN CALCIUM 80 MG PO TABS
80.0000 mg | ORAL_TABLET | Freq: Every day | ORAL | 1 refills | Status: DC
Start: 2024-01-16 — End: 2024-02-15

## 2024-01-17 ENCOUNTER — Telehealth: Payer: Self-pay

## 2024-01-17 ENCOUNTER — Other Ambulatory Visit: Payer: Self-pay | Admitting: Family Medicine

## 2024-01-17 ENCOUNTER — Encounter: Payer: Self-pay | Admitting: Family Medicine

## 2024-01-17 DIAGNOSIS — M79671 Pain in right foot: Secondary | ICD-10-CM

## 2024-01-17 NOTE — Telephone Encounter (Signed)
 Patient is aware of Xray results and would like a referral for podiatry    Copied from CRM [id number]. Topic: Clinical - Medication Question >> Jan 17, 2024  3:19 PM Sonny Dandy B wrote: Reason for CRM: pt called to follow up on xray results.pt states she is in a lot of pain. Would like someone to call her regarding the results. [phone]

## 2024-01-17 NOTE — Telephone Encounter (Signed)
 Spoke with patient and patient is aware of Xray results, and would like for the referral to podiatrist to be put in.

## 2024-01-17 NOTE — Progress Notes (Signed)
 Referral placed to podiatry for right foot pain.

## 2024-01-18 ENCOUNTER — Ambulatory Visit

## 2024-01-18 DIAGNOSIS — R1011 Right upper quadrant pain: Secondary | ICD-10-CM | POA: Diagnosis not present

## 2024-01-19 ENCOUNTER — Encounter: Payer: Self-pay | Admitting: Family Medicine

## 2024-01-21 ENCOUNTER — Ambulatory Visit: Admitting: Podiatry

## 2024-01-21 DIAGNOSIS — Z91199 Patient's noncompliance with other medical treatment and regimen due to unspecified reason: Secondary | ICD-10-CM

## 2024-01-21 NOTE — Progress Notes (Signed)
 No show

## 2024-02-02 NOTE — Therapy (Addendum)
 OUTPATIENT PHYSICAL THERAPY FEMALE PELVIC EVALUATION/ later date discharge   Patient Name: Robyn Rivera MRN: [id number] DOB:10-Mar-1976, 48 y.o., female Today's Date: 02/03/2024  END OF SESSION:  PT End of Session - 02/03/24 1714     Visit Number 1    Authorization Type Medicaid- [employer]    Authorization Time Period waiting on auth    PT Start Time 0930    PT Stop Time 1015    PT Time Calculation (min) 45 min    Activity Tolerance Patient tolerated treatment well    Behavior During Therapy WFL for tasks assessed/performed             Past Medical History:  Diagnosis Date   Abnormal Pap smear of cervix    Patient had a procedure to cervix- ?LEEP?   Anxiety    Blood transfusion without reported diagnosis    History of angina    Hypertriglyceridemia    Pancreatitis    Type 2 diabetes mellitus (HCC)    Past Surgical History:  Procedure Laterality Date   CHOLECYSTECTOMY     HERNIA REPAIR     emergency repair surgery following ovarian cystectomy   INCONTINENCE SURGERY     Stress Incontinence surgery per patient   LEEP     2018 in [place]  per patient.   ovarian cystectomy Left 2016   UPPER GASTROINTESTINAL ENDOSCOPY     Patient Active Problem List   Diagnosis Date Noted   Diabetes mellitus due to underlying condition with hyperglycemia, without long-term current use of insulin  (HCC) 01/16/2024   Right foot pain 01/14/2024   Hernia of abdominal wall 01/14/2024   Screening for cervical cancer 10/14/2023   Need for hepatitis C screening test 10/14/2023   Uses contraceptive implant for birth control 10/14/2023   Screening for colon cancer 09/13/2023   Hospital discharge follow-up 09/13/2023   Anxiety and depression 09/13/2023   Diabetes mellitus without complication (HCC) 09/06/2023   Hypercholesterolemia 09/06/2023   Acute pancreatitis 09/05/2023   Hypertriglyceridemia 09/05/2023   Dehydration 09/05/2023   Hyponatremia 09/05/2023   Transaminitis  09/05/2023    PCP: Booker Darice SAUNDERS, FNP  REFERRING PROVIDER: Ginette Shasta NOVAK, NP  REFERRING DIAG: N39.46 (ICD-10-CM) - Mixed stress and urge incontinence  THERAPY DIAG:  Other lack of coordination  Muscle weakness (generalized)  Rationale for Evaluation and Treatment: Rehabilitation  ONSET DATE: 2023  SUBJECTIVE:                                                                                                                                                                                           SUBJECTIVE STATEMENT: Pt reports that she  had surgery 9 years ago on her bladder, leaks with jumping and  coughing and when she has sex. Has diabetes, trying to drink more water, goes to the gym, trying to lose weight Had pancreatitis in October Getting married in July, under a lot of stress now  Fluid intake: green tea, sweet tea, water  PAIN:  Are you having pain? Yes- r foot, 3 weeks of pain NPRS scale: 8/10  PRECAUTIONS: None  RED FLAGS: None   WEIGHT BEARING RESTRICTIONS: No  FALLS:  Has patient fallen in last 6 months? No  OCCUPATION: stay at home mom  ACTIVITY LEVEL : active  PLOF: Independent  PATIENT GOALS: reduce leaking  PERTINENT HISTORY:  Bladder tack 9 years ago- pt can not remember  CHOLECYSTECTOMY      HERNIA REPAIR   emergency repair surgery following ovarian cystectomy   INCONTINENCE SURGERY   Stress Incontinence surgery per patient   LEEP   2018 in [place]  per patient.   ovarian cystectomy Left 2016    UPPER GASTROINTESTINAL ENDOSCOPY       Sexual abuse: No  BOWEL MOVEMENT: no issues   URINATION: Pain with urination: No Fully empty bladder: Nonot all the time Stream: combination Urgency: Yes sometimes- with too much water- 8-10 bottles Frequency: no Leakage: Urge to void, Coughing, Sneezing, Laughing, Exercise, Lifting, and Intercourse Pads: No liners- 1-2  INTERCOURSE:  Ability to have vaginal penetration Yes  Pain with  intercourse: none DrynessNo Climax: yes Marinoff Scale: 0/3 Laxative:  PREGNANCY: Vaginal deliveries 5 -27, 25, 21, 19 and 13 Tearing Yes:   Episiotomy No C-section deliveries no Currently pregnant No  PROLAPSE: None   OBJECTIVE:  Note: Objective measures were completed at Evaluation unless otherwise noted.  DIAGNOSTIC FINDINGS:  no  PATIENT SURVEYS:    PFIQ-7: 2  COGNITION: Overall cognitive status: Within functional limits for tasks assessed     SENSATION: Light touch: Appears intact  LUMBAR SPECIAL TESTS:  Stork standing: Positive knee weakness bilat    POSTURE: No Significant postural limitations   LUMBARAROM/PROM:  A/PROM A/PROM  Eval % avail  Flexion 75  Extension 75  Right lateral flexion   Left lateral flexion   Right rotation 80  Left rotation 80   (Blank rows = not tested)  LOWER EXTREMITY ROM: grossly within functional limitations overall    LOWER EXTREMITY MMT: 4/5 overall  PALPATION:   General: upper chest breathing  Pelvic Alignment: even  Abdominal: low tone, tender and tight umbilical scars present                External Perineal Exam: within functional limitations, mild dryness present                             Internal Pelvic Floor: fair tone  Patient confirms identification and approves PT to assess internal pelvic floor and treatment Yes  PELVIC MMT:   MMT eval  Vaginal 3/5  Internal Anal Sphincter   External Anal Sphincter   Puborectalis   Diastasis Recti 1 finger  (Blank rows = not tested)        TONE: fair  PROLAPSE: Mild anterior vaginal wall laxity  TODAY'S TREATMENT:  DATE: 02/03/24   EVAL see below   PATIENT EDUCATION:  Education details: HEP, expectations of PT, exam findings Person educated: Patient Education method: Explanation, Demonstration, Tactile cues, Verbal  cues, and Handouts Education comprehension: verbalized understanding, returned demonstration, verbal cues required, tactile cues required, and needs further education  HOME EXERCISE PROGRAM: [id number]  ASSESSMENT:  CLINICAL IMPRESSION: Patient is a 48 y.o. F who was seen today for physical therapy evaluation and treatment for urinary incontinence. .  She demonstrates poor coordination with breathing and pelvic floor, abdominal restrictions after many surgeries and scars, fair pelvic floor tone and strength and anterior vaginal wall laxity as well as bilateral hip and knee weakness and will benefit from PT to reduce leaking. Pt is getting married in July and wants to get this under control after 5 pregnancies and deliveries.   OBJECTIVE IMPAIRMENTS: decreased coordination, decreased ROM, decreased strength, increased fascial restrictions, impaired tone, and pain.   ACTIVITY LIMITATIONS: continence and toileting  PARTICIPATION LIMITATIONS: interpersonal relationship  PERSONAL FACTORS: Time since onset of injury/illness/exacerbation are also affecting patient's functional outcome.   REHAB POTENTIAL: Good  CLINICAL DECISION MAKING: Evolving/moderate complexity  EVALUATION COMPLEXITY: Moderate   GOALS: Goals reviewed with patient? Yes  SHORT TERM GOALS: Target date: 03/02/2024    Pt will be independent with HEP.   Baseline: Goal status: INITIAL  2.  Pt will be independent with the knack, urge suppression technique, and double voiding in order to improve bladder habits and decrease urinary incontinence.   Baseline:  Goal status: INITIAL  3.   Pt will demonstrate appropriate lateral rib cage excursion with inhale to ensure better abdominal pressure management and pelvic floor/abdominal muscle relaxation.   Baseline:  Goal status: INITIAL    LONG TERM GOALS: Target date: 10/ 24/2025  Pt will be independent with advanced HEP.   Baseline:  Goal status: INITIAL  2.  Pt will  soak 0 pads/ day to be able to participate in community activities and have intercourse without being embarrassed Baseline:  Goal status: INITIAL  3.  Pt will have max 15 point PFIQ-7 Baseline:  Goal status: INITIAL  4.  Pt will have 0/10 pain in abdominal scars to be able to tolerate exercise to prevent worsening of diabetes Baseline:  Goal status: INITIAL   PLAN:  PT FREQUENCY: 1-2x/week  PT DURATION: 6 months  PLANNED INTERVENTIONS: 97110-Therapeutic exercises, 97530- Therapeutic activity, 97112- Neuromuscular re-education, 97535- Self Care, [id number]- Manual therapy, Taping, Dry Needling, Joint mobilization, Joint manipulation, Spinal manipulation, Spinal mobilization, Cryotherapy, Moist heat, and Biofeedback  PLAN FOR NEXT SESSION: cont exercise, urge dril, scar cupping   Glenette Bookwalter, PT 02/03/2024, 5:16 PM    PHYSICAL THERAPY DISCHARGE SUMMARY  Visits from Start of Care: 1     Patient agrees to discharge. Patient goals were not met. Patient is being discharged due to not returning since the last visit.   Yocelyn Brocious, PT 04/10/24 3:18 PM

## 2024-02-03 ENCOUNTER — Ambulatory Visit: Payer: Medicaid Other | Attending: Radiology | Admitting: Physical Therapy

## 2024-02-03 ENCOUNTER — Encounter: Payer: Self-pay | Admitting: Physical Therapy

## 2024-02-03 ENCOUNTER — Other Ambulatory Visit: Payer: Self-pay

## 2024-02-03 DIAGNOSIS — R278 Other lack of coordination: Secondary | ICD-10-CM | POA: Insufficient documentation

## 2024-02-03 DIAGNOSIS — M6281 Muscle weakness (generalized): Secondary | ICD-10-CM | POA: Diagnosis present

## 2024-02-03 DIAGNOSIS — N3946 Mixed incontinence: Secondary | ICD-10-CM | POA: Insufficient documentation

## 2024-02-04 ENCOUNTER — Other Ambulatory Visit: Payer: Self-pay

## 2024-02-04 ENCOUNTER — Ambulatory Visit: Admitting: Podiatry

## 2024-02-04 ENCOUNTER — Encounter: Payer: Self-pay | Admitting: Podiatry

## 2024-02-04 DIAGNOSIS — G5761 Lesion of plantar nerve, right lower limb: Secondary | ICD-10-CM

## 2024-02-04 DIAGNOSIS — M7751 Other enthesopathy of right foot: Secondary | ICD-10-CM

## 2024-02-04 DIAGNOSIS — M216X1 Other acquired deformities of right foot: Secondary | ICD-10-CM

## 2024-02-04 DIAGNOSIS — G5762 Lesion of plantar nerve, left lower limb: Secondary | ICD-10-CM

## 2024-02-04 MED ORDER — TRIAMCINOLONE ACETONIDE 10 MG/ML IJ SUSP
2.5000 mg | Freq: Once | INTRAMUSCULAR | Status: AC
  Administered 2024-02-04: 2.5 mg via INTRA_ARTICULAR

## 2024-02-04 MED ORDER — MELOXICAM 15 MG PO TABS: 15.0000 mg | ORAL_TABLET | Freq: Every day | ORAL | 0 refills | Status: DC

## 2024-02-04 MED ORDER — DEXAMETHASONE SODIUM PHOSPHATE 120 MG/30ML IJ SOLN
4.0000 mg | Freq: Once | INTRAMUSCULAR | Status: AC
  Administered 2024-02-04: 4 mg via INTRA_ARTICULAR

## 2024-02-04 NOTE — Progress Notes (Signed)
  Subjective:  Patient ID: Robyn Rivera, female    DOB: January 10, 1976,   MRN: [id number]  No chief complaint on file.   48 y.o. female presents for concern of right foot pain that has been ongoing for about four weeks. Relates pain in the ball of the foot at all times especially when walking. States burning and shooting pains. Has tried some topical creams but no relief. She has also tried Advil  .Patient is diabetic and last A1c was  Lab Results  Component Value Date   HGBA1C 9.8 (H) 01/14/2024   .   PCP:  Mickiel Albany, FNP   Denies any other pedal complaints. Denies n/v/f/c.   Past Medical History:  Diagnosis Date   Abnormal Pap smear of cervix    Patient had a procedure to cervix- ?LEEP?   Anxiety    Blood transfusion without reported diagnosis    History of angina    Hypertriglyceridemia    Pancreatitis    Type 2 diabetes mellitus (HCC)     Objective:  Physical Exam: Vascular: DP/PT pulses 2/4 bilateral. CFT <3 seconds. Normal hair growth on digits. No edema.  Skin. No lacerations or abrasions bilateral feet.  Musculoskeletal: MMT 5/5 bilateral lower extremities in DF, PF, Inversion and Eversion. Deceased ROM in DF of ankle joint. Right third interspace tender with positive mulders click and tenderness with metarasal squeeze. Sensitivity noted to entire ball of foot.  Neurological: Sensation intact to light touch.   Assessment:   1. Morton neuroma, right      Plan:  Patient was evaluated and treated and all questions answered. Discussed neuroma and treatment options with patient.  Radiographs reviewed and discussed with patient.  Injection offered today. Patient in agreement. Procedure below.  Discussed padding and offloading today.  Prescription for meloxicam provided. Kidney function wnl.  Discussed if pain does not improve may consider  MRI for further surgical planning.  Patient to return in 6 weeks or sooner if concerns arise.   Procedure: Injection  Tendon/Ligament Discussed alternatives, risks, complications and verbal consent was obtained.  Location: Right third interspace . Skin Prep: Alcohol. Injectate: 1cc 0.5% marcaine plain, 1 cc dexamethasone 0.5 cc kenalog  Disposition: Patient tolerated procedure well. Injection site dressed with a band-aid.  Post-injection care was discussed and return precautions discussed.      Jennefer Moats, DPM

## 2024-02-13 ENCOUNTER — Encounter (HOSPITAL_BASED_OUTPATIENT_CLINIC_OR_DEPARTMENT_OTHER): Payer: Self-pay | Admitting: *Deleted

## 2024-02-13 ENCOUNTER — Other Ambulatory Visit: Payer: Self-pay

## 2024-02-13 ENCOUNTER — Observation Stay (HOSPITAL_BASED_OUTPATIENT_CLINIC_OR_DEPARTMENT_OTHER)
Admission: EM | Admit: 2024-02-13 | Discharge: 2024-02-15 | Disposition: A | Discharge status: Home / Self Care | Attending: Internal Medicine | Admitting: Internal Medicine

## 2024-02-13 ENCOUNTER — Emergency Department (HOSPITAL_BASED_OUTPATIENT_CLINIC_OR_DEPARTMENT_OTHER)

## 2024-02-13 DIAGNOSIS — F32A Depression, unspecified: Secondary | ICD-10-CM | POA: Diagnosis not present

## 2024-02-13 DIAGNOSIS — Z7984 Long term (current) use of oral hypoglycemic drugs: Secondary | ICD-10-CM | POA: Diagnosis not present

## 2024-02-13 DIAGNOSIS — E1165 Type 2 diabetes mellitus with hyperglycemia: Secondary | ICD-10-CM

## 2024-02-13 DIAGNOSIS — Z79899 Other long term (current) drug therapy: Secondary | ICD-10-CM | POA: Insufficient documentation

## 2024-02-13 DIAGNOSIS — R519 Headache, unspecified: Secondary | ICD-10-CM | POA: Diagnosis present

## 2024-02-13 DIAGNOSIS — Z23 Encounter for immunization: Secondary | ICD-10-CM | POA: Insufficient documentation

## 2024-02-13 DIAGNOSIS — E119 Type 2 diabetes mellitus without complications: Secondary | ICD-10-CM

## 2024-02-13 DIAGNOSIS — H532 Diplopia: Principal | ICD-10-CM

## 2024-02-13 DIAGNOSIS — E0865 Diabetes mellitus due to underlying condition with hyperglycemia: Secondary | ICD-10-CM

## 2024-02-13 DIAGNOSIS — E781 Pure hyperglyceridemia: Secondary | ICD-10-CM

## 2024-02-13 DIAGNOSIS — E7800 Pure hypercholesterolemia, unspecified: Secondary | ICD-10-CM

## 2024-02-13 DIAGNOSIS — E871 Hypo-osmolality and hyponatremia: Secondary | ICD-10-CM | POA: Diagnosis not present

## 2024-02-13 LAB — CBC WITH DIFFERENTIAL/PLATELET
Basophils Absolute: 0.1 10*3/uL (ref 0.0–0.1)
Basophils Relative: 1 %
Eosinophils Absolute: 0.1 10*3/uL (ref 0.0–0.5)
Eosinophils Relative: 2 %
HCT: UNDETERMINED % (ref 36.0–46.0)
Hemoglobin: 17.7 g/dL — ABNORMAL HIGH (ref 12.0–15.0)
Lymphocytes Relative: 36 %
Lymphs Abs: 2.2 10*3/uL (ref 0.7–4.0)
MCH: UNDETERMINED pg (ref 26.0–34.0)
MCHC: UNDETERMINED g/dL (ref 30.0–36.0)
MCV: UNDETERMINED fL (ref 80.0–100.0)
Monocytes Absolute: 0.4 10*3/uL (ref 0.1–1.0)
Monocytes Relative: 6 %
Neutro Abs: 3.3 10*3/uL (ref 1.7–7.7)
Neutrophils Relative %: 55 %
Platelets: 193 10*3/uL (ref 150–400)
RBC: UNDETERMINED MIL/uL (ref 3.87–5.11)
RDW: UNDETERMINED % (ref 11.5–15.5)
Smear Review: NORMAL
WBC: 6 10*3/uL (ref 4.0–10.5)
nRBC: 0 /100{WBCs}
nRBC: UNDETERMINED % (ref 0.0–0.2)

## 2024-02-13 LAB — COMPREHENSIVE METABOLIC PANEL WITH GFR
ALT: 69 U/L — ABNORMAL HIGH (ref 0–44)
AST: 76 U/L — ABNORMAL HIGH (ref 15–41)
Albumin: 4.2 g/dL (ref 3.5–5.0)
Alkaline Phosphatase: 96 U/L (ref 38–126)
Anion gap: 10 (ref 5–15)
BUN: 10 mg/dL (ref 6–20)
CO2: 17 mmol/L — ABNORMAL LOW (ref 22–32)
Calcium: 8.8 mg/dL — ABNORMAL LOW (ref 8.9–10.3)
Chloride: 95 mmol/L — ABNORMAL LOW (ref 98–111)
Creatinine, Ser: 0.75 mg/dL (ref 0.44–1.00)
GFR, Estimated: >60 mL/min (ref >60)
Glucose, Bld: 244 mg/dL — ABNORMAL HIGH (ref 70–99)
Potassium: 3.8 mmol/L (ref 3.5–5.1)
Sodium: 122 mmol/L — ABNORMAL LOW (ref 135–145)
Total Bilirubin: 1.1 mg/dL (ref 0.0–1.2)
Total Protein: 7.5 g/dL (ref 6.5–8.1)

## 2024-02-13 LAB — URINALYSIS, W/ REFLEX TO CULTURE (INFECTION SUSPECTED)
Bilirubin Urine: NEGATIVE
Glucose, UA: >=500 mg/dL — AB
Ketones, ur: NEGATIVE mg/dL
Leukocytes,Ua: NEGATIVE
Nitrite: NEGATIVE
Protein, ur: NEGATIVE mg/dL
Specific Gravity, Urine: 1.015 (ref 1.005–1.030)
pH: 5.5 (ref 5.0–8.0)

## 2024-02-13 LAB — HCG, SERUM, QUALITATIVE: Preg, Serum: NEGATIVE

## 2024-02-13 MED ORDER — IOHEXOL 350 MG/ML SOLN
75.0000 mL | Freq: Once | INTRAVENOUS | Status: AC | PRN
  Administered 2024-02-13: 75 mL via INTRAVENOUS

## 2024-02-13 MED ORDER — DIPHENHYDRAMINE HCL 50 MG/ML IJ SOLN
25.0000 mg | Freq: Once | INTRAMUSCULAR | Status: AC
  Administered 2024-02-13: 25 mg via INTRAVENOUS
  Filled 2024-02-13: qty 1

## 2024-02-13 MED ORDER — PROCHLORPERAZINE EDISYLATE 10 MG/2ML IJ SOLN
10.0000 mg | Freq: Once | INTRAMUSCULAR | Status: AC
  Administered 2024-02-13: 10 mg via INTRAVENOUS
  Filled 2024-02-13: qty 2

## 2024-02-13 MED ORDER — SODIUM CHLORIDE 0.9 % IV SOLN: Freq: Once | INTRAVENOUS | Status: AC

## 2024-02-13 NOTE — ED Notes (Signed)
 Call to lab to follow up regarding delay in lab results. Pt had lipemia in blood and her lab had to be sent to [facility] in order to be able to evaluate it

## 2024-02-13 NOTE — ED Provider Notes (Signed)
 [facility] Provider Note   CSN: [id number] Arrival date & time: 02/13/24  1653     History {Add pertinent medical, surgical, social history, OB history to HPI:1} Chief Complaint  Patient presents with   Headache    Robyn Rivera is a 48 y.o. female.  HPI     Woke up 3 days ago with dizziness, some room spinning, double vision, pain on the left side of head.  Pain in back of eye on the left side. Woke up with these symptoms 3 days ago.  Left side the part that hurts also feels different No weakness,no difficulty talking Feels off balance walking  In [place] was admitted low sodium 15 years ago, not sure why-gave her caffeine?  Doublte vision is present with both eyes open, when closing one is better, if staring in one point for a long time like 4 seconds or more can focus  No recent illness, no nausea, no vomiting Drinking water/gatorade   Mom has history of strokes   Meloxicam  started 4/25    Past Medical History:  Diagnosis Date   Abnormal Pap smear of cervix    Patient had a procedure to cervix- ?LEEP?   Anxiety    Blood transfusion without reported diagnosis    History of angina    Hypertriglyceridemia    Pancreatitis    Type 2 diabetes mellitus (HCC)     Home Medications Prior to Admission medications   Medication Sig Start Date End Date Taking? Authorizing Provider  acetaminophen  (TYLENOL ) 325 MG tablet Take 2 tablets (650 mg total) by mouth every 6 (six) hours as needed for mild pain (pain score 1-3) (or Fever >/= 101). 09/09/23   Barbee Lew, MD  atorvastatin  (LIPITOR) 80 MG tablet Take 1 tablet (80 mg total) by mouth daily. 01/16/24   Mickiel Albany, FNP  Blood Glucose Monitoring Suppl DEVI 1 each by Does not apply route in the morning, at noon, and at bedtime. May substitute to any manufacturer covered by patient's insurance. 01/14/24   Mickiel Albany, FNP  etonogestrel (NEXPLANON) 68 MG IMPL  implant 1 each by Subdermal route once. Left arm---patient unsure when inserted    [provider]  fenofibrate  160 MG tablet Take 1 tablet (160 mg total) by mouth daily. 01/16/24   Mickiel Albany, FNP  glipiZIDE  (GLUCOTROL ) 5 MG tablet Take 1 tablet (5 mg total) by mouth daily before breakfast. 01/16/24   Mickiel Albany, FNP  Glucose Blood (BLOOD GLUCOSE TEST STRIPS) STRP 1 each by In Vitro route in the morning, at noon, and at bedtime. May substitute to any manufacturer covered by patient's insurance. 01/14/24   Mickiel Albany, FNP  ibuprofen  (ADVIL ) 200 MG tablet Take 200 mg by mouth every 6 (six) hours as needed.    [provider]  Lancet Device MISC 1 each by Does not apply route in the morning, at noon, and at bedtime. May substitute to any manufacturer covered by patient's insurance. 01/14/24 02/13/24  Mickiel Albany, FNP  Lancets Misc. MISC 1 each by Does not apply route in the morning, at noon, and at bedtime. May substitute to any manufacturer covered by patient's insurance. 01/14/24 02/13/24  Mickiel Albany, FNP  meloxicam  (MOBIC ) 15 MG tablet Take 1 tablet (15 mg total) by mouth daily. 02/04/24   Sikora, Rebecca, DPM  metFORMIN  (GLUCOPHAGE ) 850 MG tablet Take 1 tablet (850 mg total) by mouth 2 (two) times daily with  a meal. 01/14/24   Mickiel Albany, FNP  omega-3 acid ethyl esters (LOVAZA ) 1 g capsule Take 2 capsules (2 g total) by mouth 2 (two) times daily. 01/14/24   Mickiel Albany, FNP  SUFLAVE  178.7 g SOLR Take by mouth. 12/28/23   [provider]      Allergies    Penicillins    Review of Systems   Review of Systems  Physical Exam Updated Vital Signs BP 129/81   Pulse 69   Temp 98 F (36.7 C) (Oral)   Resp 18   LMP 01/05/2024 (Approximate)   SpO2 95%  Physical Exam  ED Results / Procedures / Treatments   Labs (all labs ordered are listed, but only abnormal results are displayed) Labs Reviewed  CBC WITH DIFFERENTIAL/PLATELET - Abnormal; Notable for the  following components:      Result Value   Hemoglobin 17.7 (*)    All other components within normal limits  COMPREHENSIVE METABOLIC PANEL WITH GFR - Abnormal; Notable for the following components:   Sodium 122 (*)    Chloride 95 (*)    CO2 17 (*)    Glucose, Bld 244 (*)    Calcium  8.8 (*)    AST 76 (*)    ALT 69 (*)    All other components within normal limits  HCG, SERUM, QUALITATIVE  CBG MONITORING, ED    EKG None  Radiology CT ANGIO HEAD NECK W WO CM Result Date: 02/13/2024 CLINICAL DATA:  [employer]. EXAM: CT ANGIOGRAPHY HEAD AND NECK WITH AND WITHOUT CONTRAST TECHNIQUE: Multidetector CT imaging of the head and neck was performed using the standard protocol during bolus administration of intravenous contrast. Multiplanar CT image reconstructions and MIPs were obtained to evaluate the vascular anatomy. Carotid stenosis measurements (when applicable) are obtained utilizing NASCET criteria, using the distal internal carotid diameter as the denominator. RADIATION DOSE REDUCTION: This exam was performed according to the departmental dose-optimization program which includes automated exposure control, adjustment of the mA and/or kV according to patient size and/or use of iterative reconstruction technique. CONTRAST:  75mL OMNIPAQUE  IOHEXOL  350 MG/ML SOLN COMPARISON:  None Available. FINDINGS: CT HEAD FINDINGS Brain: No evidence of acute infarction, hemorrhage, hydrocephalus, extra-axial collection or mass lesion/mass effect. Vascular: No hyperdense vessel. Skull: No acute fracture. Sinuses/Orbits: Clear sinuses.  No acute orbital findings. Other: No mastoid effusions. Review of the MIP images confirms the above findings CTA NECK FINDINGS Aortic arch: Great vessel origins are patent without significant stenosis. Right carotid system: No evidence of dissection, stenosis (50% or greater), or occlusion. Left carotid system: No evidence of dissection, stenosis (50% or greater), or occlusion. Vertebral  arteries: Left dominant. No evidence of dissection, stenosis (50% or greater), or occlusion. Skeleton: No acute abnormality on limited assessment. Other neck: No acute abnormality on limited assessment. Upper chest: Visualized lung apices are clear. Review of the MIP images confirms the above findings CTA HEAD FINDINGS Anterior circulation: Bilateral intracranial ICAs, MCAs, and ACAs are patent without proximal hemodynamically significant stenosis. Posterior circulation: Bilateral intradural vertebral arteries, basilar artery and bilateral posterior cerebral arteries are patent without proximal hemodynamically significant stenosis. Venous sinuses: As permitted by contrast timing, patent. Review of the MIP images confirms the above findings IMPRESSION: 1. No evidence of acute intracranial abnormality. 2. No large vessel occlusion or proximal hemodynamically significant stenosis. Electronically Signed   By: Stevenson Elbe M.D.   On: 02/13/2024 20:21    Procedures Procedures  {Document cardiac monitor, telemetry assessment procedure when appropriate:1}  Medications Ordered in ED Medications  iohexol  (OMNIPAQUE ) 350 MG/ML injection 75 mL (75 mLs Intravenous Contrast Given 02/13/24 1928)    ED Course/ Medical Decision Making/ A&P   {   Click here for ABCD2, HEART and other calculatorsREFRESH Note before signing :1}                              Medical Decision Making Amount and/or Complexity of Data Reviewed Labs: ordered. Radiology: ordered.  Risk Prescription drug management.   ***  {Document critical care time when appropriate:1} {Document review of labs and clinical decision tools ie heart score, Chads2Vasc2 etc:1}  {Document your independent review of radiology images, and any outside records:1} {Document your discussion with family members, caretakers, and with consultants:1} {Document social determinants of health affecting pt's care:1} {Document your decision making why or why  not admission, treatments were needed:1} Final Clinical Impression(s) / ED Diagnoses Final diagnoses:  None    Rx / DC Orders ED Discharge Orders     None

## 2024-02-13 NOTE — ED Triage Notes (Addendum)
 Pt has had a headache for 3 days.  Pt states that the headache is on the left side and has been severe and associated with pain in her left eye when she blinks or moves it.  She also reports that she has double vision and blurred vision. Not associated with any trauma, not on blood thinners.  No neuro deficits.

## 2024-02-14 ENCOUNTER — Encounter (HOSPITAL_COMMUNITY): Payer: Self-pay | Admitting: Radiology

## 2024-02-14 ENCOUNTER — Observation Stay (HOSPITAL_COMMUNITY)

## 2024-02-14 DIAGNOSIS — G43109 Migraine with aura, not intractable, without status migrainosus: Secondary | ICD-10-CM

## 2024-02-14 DIAGNOSIS — Z79899 Other long term (current) drug therapy: Secondary | ICD-10-CM | POA: Diagnosis not present

## 2024-02-14 DIAGNOSIS — E871 Hypo-osmolality and hyponatremia: Secondary | ICD-10-CM | POA: Diagnosis not present

## 2024-02-14 DIAGNOSIS — H532 Diplopia: Secondary | ICD-10-CM | POA: Diagnosis not present

## 2024-02-14 DIAGNOSIS — Z23 Encounter for immunization: Secondary | ICD-10-CM | POA: Diagnosis not present

## 2024-02-14 DIAGNOSIS — R519 Headache, unspecified: Secondary | ICD-10-CM | POA: Diagnosis present

## 2024-02-14 DIAGNOSIS — E119 Type 2 diabetes mellitus without complications: Secondary | ICD-10-CM | POA: Diagnosis not present

## 2024-02-14 DIAGNOSIS — F32A Depression, unspecified: Secondary | ICD-10-CM | POA: Diagnosis not present

## 2024-02-14 DIAGNOSIS — E781 Pure hyperglyceridemia: Secondary | ICD-10-CM | POA: Diagnosis not present

## 2024-02-14 DIAGNOSIS — Z7984 Long term (current) use of oral hypoglycemic drugs: Secondary | ICD-10-CM | POA: Diagnosis not present

## 2024-02-14 LAB — BASIC METABOLIC PANEL WITH GFR
Anion gap: 5 (ref 5–15)
BUN: 12 mg/dL (ref 6–20)
CO2: 20 mmol/L — ABNORMAL LOW (ref 22–32)
Calcium: 8.6 mg/dL — ABNORMAL LOW (ref 8.9–10.3)
Chloride: 111 mmol/L (ref 98–111)
Creatinine, Ser: 0.59 mg/dL (ref 0.44–1.00)
GFR, Estimated: >60 mL/min (ref >60)
Glucose, Bld: 228 mg/dL — ABNORMAL HIGH (ref 70–99)
Potassium: 3.8 mmol/L (ref 3.5–5.1)
Sodium: 136 mmol/L (ref 135–145)

## 2024-02-14 LAB — CBC
HCT: 34 % — ABNORMAL LOW (ref 36.0–46.0)
Hemoglobin: 12.2 g/dL (ref 12.0–15.0)
MCH: 31.6 pg (ref 26.0–34.0)
MCHC: 35.9 g/dL (ref 30.0–36.0)
MCV: 88.1 fL (ref 80.0–100.0)
Platelets: 147 10*3/uL — ABNORMAL LOW (ref 150–400)
RBC: 3.86 MIL/uL — ABNORMAL LOW (ref 3.87–5.11)
RDW: 15.4 % (ref 11.5–15.5)
WBC: 5.1 10*3/uL (ref 4.0–10.5)
nRBC: 0.4 % — ABNORMAL HIGH (ref 0.0–0.2)

## 2024-02-14 LAB — LIPID PANEL
Cholesterol: 727 mg/dL — ABNORMAL HIGH (ref 0–200)
LDL Cholesterol: UNDETERMINED mg/dL (ref 0–99)
Triglycerides: >5000 mg/dL — ABNORMAL HIGH (ref <150)
VLDL: UNDETERMINED mg/dL (ref 0–40)

## 2024-02-14 LAB — LDL CHOLESTEROL, DIRECT: Direct LDL: 70 mg/dL (ref 0–99)

## 2024-02-14 LAB — OSMOLALITY, URINE: Osmolality, Ur: 894 mosm/kg (ref 300–900)

## 2024-02-14 LAB — HEMOGLOBIN A1C
Hgb A1c MFr Bld: 10.6 % — ABNORMAL HIGH (ref 4.8–5.6)
Mean Plasma Glucose: 257.52 mg/dL

## 2024-02-14 LAB — CBG MONITORING, ED: Glucose-Capillary: 227 mg/dL — ABNORMAL HIGH (ref 70–99)

## 2024-02-14 LAB — GLUCOSE, CAPILLARY: Glucose-Capillary: 162 mg/dL — ABNORMAL HIGH (ref 70–99)

## 2024-02-14 LAB — SODIUM, URINE, RANDOM: Sodium, Ur: 66 mmol/L

## 2024-02-14 MED ORDER — KETOROLAC TROMETHAMINE 30 MG/ML IJ SOLN
15.0000 mg | Freq: Once | INTRAMUSCULAR | Status: AC
  Administered 2024-02-14: 15 mg via INTRAVENOUS
  Filled 2024-02-14: qty 1

## 2024-02-14 MED ORDER — SENNOSIDES-DOCUSATE SODIUM 8.6-50 MG PO TABS: 1.0000 | ORAL_TABLET | Freq: Every evening | ORAL | Status: DC | PRN

## 2024-02-14 MED ORDER — FENOFIBRATE 160 MG PO TABS
160.0000 mg | ORAL_TABLET | Freq: Every day | ORAL | Status: DC
  Administered 2024-02-14 – 2024-02-15 (×2): 160 mg via ORAL
  Filled 2024-02-14 (×2): qty 1

## 2024-02-14 MED ORDER — STROKE: EARLY STAGES OF RECOVERY BOOK
Freq: Once | Status: AC
  Filled 2024-02-14: qty 1

## 2024-02-14 MED ORDER — ACETAMINOPHEN 160 MG/5ML PO SOLN: 650.0000 mg | ORAL | Status: DC | PRN

## 2024-02-14 MED ORDER — OMEGA-3-ACID ETHYL ESTERS 1 G PO CAPS
2.0000 g | ORAL_CAPSULE | Freq: Two times a day (BID) | ORAL | Status: DC
  Administered 2024-02-14 – 2024-02-15 (×2): 2 g via ORAL
  Filled 2024-02-14 (×3): qty 2

## 2024-02-14 MED ORDER — ACETAMINOPHEN 325 MG PO TABS
650.0000 mg | ORAL_TABLET | ORAL | Status: DC | PRN
  Administered 2024-02-14: 650 mg via ORAL
  Filled 2024-02-14: qty 2

## 2024-02-14 MED ORDER — SODIUM CHLORIDE 0.9 % IV SOLN: INTRAVENOUS | Status: DC

## 2024-02-14 MED ORDER — ATORVASTATIN CALCIUM 80 MG PO TABS
80.0000 mg | ORAL_TABLET | Freq: Every day | ORAL | Status: DC
Start: 2024-02-14 — End: 2024-02-15
  Administered 2024-02-14 – 2024-02-15 (×2): 80 mg via ORAL
  Filled 2024-02-14 (×2): qty 1

## 2024-02-14 MED ORDER — PNEUMOCOCCAL 20-VAL CONJ VACC 0.5 ML IM SUSY
0.5000 mL | PREFILLED_SYRINGE | INTRAMUSCULAR | Status: AC | PRN
  Administered 2024-02-15: 0.5 mL via INTRAMUSCULAR
  Filled 2024-02-14: qty 0.5

## 2024-02-14 MED ORDER — ACETAMINOPHEN 650 MG RE SUPP: 650.0000 mg | RECTAL | Status: DC | PRN

## 2024-02-14 MED ORDER — INSULIN ASPART 100 UNIT/ML IJ SOLN: 0.0000 [IU] | Freq: Every day | INTRAMUSCULAR | Status: DC

## 2024-02-14 MED ORDER — INSULIN ASPART 100 UNIT/ML IJ SOLN
0.0000 [IU] | Freq: Three times a day (TID) | INTRAMUSCULAR | Status: DC
  Administered 2024-02-15 (×2): 5 [IU] via SUBCUTANEOUS
  Administered 2024-02-15: 8 [IU] via SUBCUTANEOUS

## 2024-02-14 MED ORDER — ASPIRIN 81 MG PO CHEW
324.0000 mg | CHEWABLE_TABLET | Freq: Once | ORAL | Status: AC
  Administered 2024-02-14: 324 mg via ORAL
  Filled 2024-02-14: qty 4

## 2024-02-14 MED ORDER — HEPARIN SODIUM (PORCINE) 5000 UNIT/ML IJ SOLN
5000.0000 [IU] | Freq: Three times a day (TID) | INTRAMUSCULAR | Status: DC
  Administered 2024-02-14 – 2024-02-15 (×3): 5000 [IU] via SUBCUTANEOUS
  Filled 2024-02-14 (×3): qty 1

## 2024-02-14 NOTE — ED Notes (Signed)
 Lab notified CN that serum osmolality could not be run on pt's sample due to lipemic sample. EDP aware.

## 2024-02-14 NOTE — Plan of Care (Signed)

## 2024-02-14 NOTE — H&P (Signed)
 [facility] H&P   Patient Demographics:    Robyn Rivera, is a 48 y.o. female  MRN: [id number]   DOB - January 04, 1976  Admit Date - 02/13/2024  Outpatient Primary MD for the patient is Mickiel Albany, FNP  Referring MD/NP/PA: [facility]  Patient coming from: Home  Chief Complaint  Patient presents with   Headache      HPI:    Robyn Rivera  is a 48 y.o. female, with past medical history of type 2 diabetes mellitus, hypertriglyceridemia with acute pancreatitis in the past, patient presents to ED secondary to complaints of unsteady gait, double vision, over the last 3 days, patient reports she woke up from sleep, with some dizziness, room spinning, double vision, pain on the left side of the head, migraine, reports symptoms did not improve which prompted her to come to ED, she denies any loss of consciousness, any tingling or numbness or focal deficits on any extremities. - In ED CT head, CTA head and neck were negative, workup noted for low sodium at 122, but repeat came at 136, given her findings she was transferred to [facility], ED for further workup    Review of systems:     A full 10 point Review of Systems was done, except as stated above, all other Review of Systems were negative.   With Past History of the following :    Past Medical History:  Diagnosis Date   Abnormal Pap smear of cervix    Patient had a procedure to cervix- ?LEEP?   Anxiety    Blood transfusion without reported diagnosis    History of angina    Hypertriglyceridemia    Pancreatitis    Type 2 diabetes mellitus (HCC)       Past Surgical History:  Procedure Laterality Date   CHOLECYSTECTOMY     HERNIA REPAIR     emergency repair surgery following ovarian cystectomy   INCONTINENCE SURGERY     Stress Incontinence surgery per patient   LEEP     2018 in [place]  per patient.   ovarian cystectomy Left  2016   UPPER GASTROINTESTINAL ENDOSCOPY        Social History:     Social History   Tobacco Use   Smoking status: Never    Passive exposure: Never   Smokeless tobacco: Never  Substance Use Topics   Alcohol use: Yes    Alcohol/week: 3.0 standard drinks of alcohol    Types: 3 Shots of liquor per week    Comment: socailly 2-3 times per month       Family History :     Family History  Problem Relation Age of Onset   Diabetes Mother    Hyperlipidemia Mother    Diabetes Father    Diabetes Sister    Diabetes Brother    Prostate cancer Maternal Grandfather    Colon cancer Neg Hx  Colon polyps Neg Hx    Esophageal cancer Neg Hx    Rectal cancer Neg Hx    Stomach cancer Neg Hx       Home Medications:   Prior to Admission medications   Medication Sig Start Date End Date Taking? Authorizing Provider  ARTIFICIAL TEARS PF 0.1-0.3 % SOLN Place 1 drop into both eyes 3 (three) times daily as needed (for dryness).   Yes [provider]  ibuprofen  (ADVIL ) 200 MG tablet Take 800-1,000 mg by mouth 2 (two) times daily as needed (for pain or headaches).   Yes [provider]  meloxicam  (MOBIC ) 15 MG tablet Take 1 tablet (15 mg total) by mouth daily. 02/04/24  Yes Sikora, Rebecca, DPM  metFORMIN  (GLUCOPHAGE ) 850 MG tablet Take 1 tablet (850 mg total) by mouth 2 (two) times daily with a meal. 01/14/24  Yes Mickiel Albany, FNP  omega-3 acid ethyl esters (LOVAZA ) 1 g capsule Take 2 capsules (2 g total) by mouth 2 (two) times daily. 01/14/24  Yes Mickiel Albany, FNP  TYLENOL  500 MG tablet Take 500-1,000 mg by mouth every 6 (six) hours as needed (for pain or headaches).   Yes [provider]  acetaminophen  (TYLENOL ) 325 MG tablet Take 2 tablets (650 mg total) by mouth every 6 (six) hours as needed for mild pain (pain score 1-3) (or Fever >/= 101). Patient not taking: Reported on 02/14/2024 09/09/23   Barbee Lew, MD  atorvastatin  (LIPITOR) 80 MG tablet Take 1  tablet (80 mg total) by mouth daily. Patient not taking: Reported on 02/14/2024 01/16/24   Mickiel Albany, FNP  Blood Glucose Monitoring Suppl DEVI 1 each by Does not apply route in the morning, at noon, and at bedtime. May substitute to any manufacturer covered by patient's insurance. 01/14/24   Mickiel Albany, FNP  fenofibrate  160 MG tablet Take 1 tablet (160 mg total) by mouth daily. Patient not taking: Reported on 02/14/2024 01/16/24   Mickiel Albany, FNP  glipiZIDE  (GLUCOTROL ) 5 MG tablet Take 1 tablet (5 mg total) by mouth daily before breakfast. Patient not taking: Reported on 02/14/2024 01/16/24   Mickiel Albany, FNP  Glucose Blood (BLOOD GLUCOSE TEST STRIPS) STRP 1 each by In Vitro route in the morning, at noon, and at bedtime. May substitute to any manufacturer covered by patient's insurance. 01/14/24   Mickiel Albany, FNP  Lancet Device MISC 1 each by Does not apply route in the morning, at noon, and at bedtime. May substitute to any manufacturer covered by patient's insurance. 01/14/24 02/14/24  Mickiel Albany, FNP  Lancets Misc. MISC 1 each by Does not apply route in the morning, at noon, and at bedtime. May substitute to any manufacturer covered by patient's insurance. 01/14/24 02/14/24  Mickiel Albany, FNP     Allergies:     Allergies  Allergen Reactions   Icy Hot Swelling and Other (See Comments)    Face puffed out/became swollen and the patient had to go to the hospital.   Penicillins Other (See Comments)    Patient states "when I was little, I almost died."  Was tried on a very low dose later in life and tolerate that.     Physical Exam:   Vitals  Blood pressure 134/86, pulse 76, temperature 98.1 F (36.7 C), temperature source Oral, resp. rate 14, SpO2 98%.   1. General Developed female, laying in bed, no apparent distress  2. Normal affect and insight, Not Suicidal or  Homicidal, Awake Alert, Oriented X 3.  3.  5/5 all 4 extremities, Sensation intact all 4 extremities, Plantars down  going.  Positive Romberg sign  4. Ears and Eyes appear Normal, Conjunctivae clear, PERRLA. Moist Oral Mucosa.  5. Supple Neck, No JVD, No cervical lymphadenopathy appriciated, No Carotid Bruits.  6. Symmetrical Chest wall movement, Good air movement bilaterally, CTAB.  7. RRR, No Gallops, Rubs or Murmurs, No Parasternal Heave.  8. Positive Bowel Sounds, Abdomen Soft, No tenderness, No organomegaly appriciated,No rebound -guarding or rigidity.  9.  No Cyanosis, Normal Skin Turgor, No Skin Rash or Bruise.  10. Good muscle tone,  joints appear normal , no effusions, Normal ROM.     Data Review:    CBC Recent Labs  Lab 02/13/24 1743 02/14/24 1601  WBC 6.0 5.1  HGB 17.7* 12.2  HCT UNABLE TO REPORT DUE TO LIPEMIC INTERFERENCE 34.0*  PLT 193 147*  MCV UNABLE TO REPORT DUE TO LIPEMIC INTERFERENCE 88.1  MCH UNABLE TO REPORT DUE TO LIPEMIC INTERFERENCE 31.6  MCHC UNABLE TO REPORT DUE TO LIPEMIC INTERFERENCE 35.9  RDW UNABLE TO REPORT DUE TO LIPEMIC INTERFERENCE 15.4  LYMPHSABS 2.2  --   MONOABS 0.4  --   EOSABS 0.1  --   BASOSABS 0.1  --    ------------------------------------------------------------------------------------------------------------------  Chemistries  Recent Labs  Lab 02/13/24 1743 02/14/24 0826  NA 122* 136  K 3.8 3.8  CL 95* 111  CO2 17* 20*  GLUCOSE 244* 228*  BUN 10 12  CREATININE 0.75 0.59  CALCIUM  8.8* 8.6*  AST 76*  --   ALT 69*  --   ALKPHOS 96  --   BILITOT 1.1  --    ------------------------------------------------------------------------------------------------------------------ CrCl cannot be calculated (Unknown ideal weight.). ------------------------------------------------------------------------------------------------------------------ No results for input(s): "TSH", "T4TOTAL", "T3FREE", "THYROIDAB" in the last 72 hours.  Invalid input(s): "FREET3"  Coagulation profile No results for input(s): "INR", "PROTIME" in the last 168  hours. ------------------------------------------------------------------------------------------------------------------- No results for input(s): "DDIMER" in the last 72 hours. -------------------------------------------------------------------------------------------------------------------  Cardiac Enzymes No results for input(s): "CKMB", "TROPONINI", "MYOGLOBIN" in the last 168 hours.  Invalid input(s): "CK" ------------------------------------------------------------------------------------------------------------------ No results found for: "BNP"   ---------------------------------------------------------------------------------------------------------------  Urinalysis    Component Value Date/Time   COLORURINE YELLOW 02/13/2024 2254   APPEARANCEUR CLOUDY (A) 02/13/2024 2254   LABSPEC 1.015 02/13/2024 2254   PHURINE 5.5 02/13/2024 2254   GLUCOSEU >=500 (A) 02/13/2024 2254   HGBUR TRACE (A) 02/13/2024 2254   BILIRUBINUR NEGATIVE 02/13/2024 2254   KETONESUR NEGATIVE 02/13/2024 2254   PROTEINUR NEGATIVE 02/13/2024 2254   NITRITE NEGATIVE 02/13/2024 2254   LEUKOCYTESUR NEGATIVE 02/13/2024 2254    ----------------------------------------------------------------------------------------------------------------   Imaging Results:    CT ANGIO HEAD NECK W WO CM Result Date: 02/13/2024 CLINICAL DATA:  [employer]. EXAM: CT ANGIOGRAPHY HEAD AND NECK WITH AND WITHOUT CONTRAST TECHNIQUE: Multidetector CT imaging of the head and neck was performed using the standard protocol during bolus administration of intravenous contrast. Multiplanar CT image reconstructions and MIPs were obtained to evaluate the vascular anatomy. Carotid stenosis measurements (when applicable) are obtained utilizing NASCET criteria, using the distal internal carotid diameter as the denominator. RADIATION DOSE REDUCTION: This exam was performed according to the departmental dose-optimization program which includes  automated exposure control, adjustment of the mA and/or kV according to patient size and/or use of iterative reconstruction technique. CONTRAST:  75mL OMNIPAQUE  IOHEXOL  350 MG/ML SOLN COMPARISON:  None Available. FINDINGS: CT HEAD FINDINGS Brain: No evidence of acute infarction, hemorrhage, hydrocephalus, extra-axial collection  or mass lesion/mass effect. Vascular: No hyperdense vessel. Skull: No acute fracture. Sinuses/Orbits: Clear sinuses.  No acute orbital findings. Other: No mastoid effusions. Review of the MIP images confirms the above findings CTA NECK FINDINGS Aortic arch: Great vessel origins are patent without significant stenosis. Right carotid system: No evidence of dissection, stenosis (50% or greater), or occlusion. Left carotid system: No evidence of dissection, stenosis (50% or greater), or occlusion. Vertebral arteries: Left dominant. No evidence of dissection, stenosis (50% or greater), or occlusion. Skeleton: No acute abnormality on limited assessment. Other neck: No acute abnormality on limited assessment. Upper chest: Visualized lung apices are clear. Review of the MIP images confirms the above findings CTA HEAD FINDINGS Anterior circulation: Bilateral intracranial ICAs, MCAs, and ACAs are patent without proximal hemodynamically significant stenosis. Posterior circulation: Bilateral intradural vertebral arteries, basilar artery and bilateral posterior cerebral arteries are patent without proximal hemodynamically significant stenosis. Venous sinuses: As permitted by contrast timing, patent. Review of the MIP images confirms the above findings IMPRESSION: 1. No evidence of acute intracranial abnormality. 2. No large vessel occlusion or proximal hemodynamically significant stenosis. Electronically Signed   By: Stevenson Elbe M.D.   On: 02/13/2024 20:21      Assessment & Plan:    Principal Problem:   Double vision Active Problems:   Hypertriglyceridemia   Hyponatremia   Diabetes  mellitus without complication (HCC)   Double vision/unsteady gait - Patient's symptoms has been going on over the last few days, findings concerning for acute ischemic event.-Less likely benign positional vertigo. - 2D echo. - Monitor on telemetry - CTA head and neck with no LVO - MRI brain is pending - Check lipid panel - Elevated at 10.4 - Will consult PT, OT, SLP - Will give full dose aspirin  - Will consult neurology if MRI is positive  Hyponatremia - No clear etiology, but this has resolved upon repeat labs after IV hydration Diabetes mellitus, type II, poorly controlled, with A1c of 10.4 -Hold oral agent, will start on insulin  sliding scale  Hypertriglyceridemia - Patient only on fish oil, unclear why her fenofibrate  and statin has been refilled -Will recheck lipid panel.    DVT Prophylaxis Heparin   AM Labs Ordered, also please review Full Orders  Family Communication: Admission, patients condition and plan of care including tests being ordered have been discussed with the patient who indicate understanding and agree with the plan and Code Status.  Code Status   Likely DC to    Consults called: neurology    Admission status: observation    Time spent in minutes : 60 minutes   Seena Dadds M.D on 02/14/2024 at 5:28 PM   [facility] - Office  [phone]

## 2024-02-14 NOTE — ED Notes (Signed)
 [facility] called for transport @1 :40pm.  Spoke with Andy Bannister

## 2024-02-14 NOTE — Consult Note (Signed)
 NEUROLOGY CONSULT NOTE   Date of service: Feb 14, 2024 Patient Name: Robyn Rivera MRN:  [id number] DOB:  1976-02-15 Chief Complaint: "Double vision" Requesting Provider: Epifanio Haste, MD  History of Present Illness  Robyn Rivera is a 48 y.o. female with hx of hypertriglyceridemia and type 2 diabetes who presents with left-sided unilateral headache, photophobia, numbness, double vision as well as spots in her vision.  She has also had dizziness which she describes as vertigo.  Due to the symptoms, stroke was suspected and an MRI was obtained which is negative.  Neurology has therefore been consulted for further recommendations.  She states that she has never had similar headache or neurological symptoms.  She denies getting headaches that make her want a lay down in a dark room.  She does have a family history of complicated migraine in her brother.  Past History   Past Medical History:  Diagnosis Date   Abnormal Pap smear of cervix    Patient had a procedure to cervix- ?LEEP?   Anxiety    Blood transfusion without reported diagnosis    History of angina    Hypertriglyceridemia    Pancreatitis    Type 2 diabetes mellitus (HCC)     Past Surgical History:  Procedure Laterality Date   CHOLECYSTECTOMY     HERNIA REPAIR     emergency repair surgery following ovarian cystectomy   INCONTINENCE SURGERY     Stress Incontinence surgery per patient   LEEP     2018 in [place]  per patient.   ovarian cystectomy Left 2016   UPPER GASTROINTESTINAL ENDOSCOPY      Family History: Family History  Problem Relation Age of Onset   Diabetes Mother    Hyperlipidemia Mother    Diabetes Father    Diabetes Sister    Diabetes Brother    Prostate cancer Maternal Grandfather    Colon cancer Neg Hx    Colon polyps Neg Hx    Esophageal cancer Neg Hx    Rectal cancer Neg Hx    Stomach cancer Neg Hx     Social History  reports that she has never smoked. She has never been exposed  to tobacco smoke. She has never used smokeless tobacco. She reports current alcohol use of about 3.0 standard drinks of alcohol per week. She reports that she does not use drugs.  Allergies  Allergen Reactions   Icy Hot Swelling and Other (See Comments)    Face puffed out/became swollen and the patient had to go to the hospital.   Penicillins Other (See Comments)    Patient states "when I was little, I almost died."  Was tried on a very low dose later in life and tolerate that.    Medications   Current Facility-Administered Medications:    [START ON 02/15/2024]  stroke: early stages of recovery book, , Does not apply, Once, Elgergawy, Ardia Kraft, MD   0.9 %  sodium chloride  infusion, , Intravenous, Continuous, Elgergawy, Ardia Kraft, MD, Last Rate: 40 mL/hr at 02/14/24 1757, New Bag at 02/14/24 1757   acetaminophen  (TYLENOL ) tablet 650 mg, 650 mg, Oral, Q4H PRN, 650 mg at 02/14/24 1750 **OR** acetaminophen  (TYLENOL ) 160 MG/5ML solution 650 mg, 650 mg, Per Tube, Q4H PRN **OR** acetaminophen  (TYLENOL ) suppository 650 mg, 650 mg, Rectal, Q4H PRN, Elgergawy, Ardia Kraft, MD   atorvastatin  (LIPITOR) tablet 80 mg, 80 mg, Oral, Daily, Elgergawy, Dawood S, MD, 80 mg at 02/14/24 1750   fenofibrate  tablet 160 mg, 160 mg, Oral,  Daily, Elgergawy, Ardia Kraft, MD, 160 mg at 02/14/24 1750   heparin injection 5,000 Units, 5,000 Units, Subcutaneous, Q8H, Elgergawy, Ardia Kraft, MD, 5,000 Units at 02/14/24 2223   [START ON 02/15/2024] insulin  aspart (novoLOG) injection 0-15 Units, 0-15 Units, Subcutaneous, TID WC, Elgergawy, Dawood S, MD   insulin  aspart (novoLOG) injection 0-5 Units, 0-5 Units, Subcutaneous, QHS, Elgergawy, Dawood S, MD   omega-3 acid ethyl esters (LOVAZA ) capsule 2 g, 2 g, Oral, BID, Elgergawy, Dawood S, MD, 2 g at 02/14/24 2223   pneumococcal 20-valent conjugate vaccine (PREVNAR 20) injection 0.5 mL, 0.5 mL, Intramuscular, Prior to discharge, Elgergawy, Ardia Kraft, MD   senna-docusate (Senokot-S) tablet 1  tablet, 1 tablet, Oral, QHS PRN, Elgergawy, Ardia Kraft, MD  Vitals   Vitals:   02/14/24 1015 02/14/24 1510 02/14/24 1600 02/14/24 1920  BP: 124/78 134/86 126/71 131/78  Pulse: 70 76 67 70  Resp: 10 14 13 16   Temp:  98.1 F (36.7 C)  98.3 F (36.8 C)  TempSrc:  Oral  Oral  SpO2: 98%  95% 96%    There is no height or weight on file to calculate BMI.  Physical Exam   Constitutional: Appears well-developed and well-nourished.   Neurologic Examination    Neuro: Mental Status: Patient is awake, alert, oriented to person, place, month, year, and situation. Patient is able to give a clear and coherent history. No signs of aphasia or neglect Cranial Nerves: II: Visual Fields are full. Pupils are equal, round, and reactive to light.   III,IV, VI: EOMI, I am unable to appreciate disconjugate gaze, but she states that she sees slight disc conjugation with upgaze V: Facial sensation is diminished to touch on the forehead, she does split the midline VII: Facial movement is symmetric.  VIII: hearing is intact to voice X: Uvula elevates symmetrically XII: tongue is midline without atrophy or fasciculations.  Motor: Tone is normal. Bulk is normal. 5/5 strength was present in all four extremities.  Sensory: Sensation is diminished in the left arm Cerebellar: FNF intact bilaterally    Labs/Imaging/Neurodiagnostic studies   CBC:  Recent Labs  Lab Feb 16, 2024 1743 02/14/24 1601  WBC 6.0 5.1  NEUTROABS 3.3  --   HGB 17.7* 12.2  HCT UNABLE TO REPORT DUE TO LIPEMIC INTERFERENCE 34.0*  MCV UNABLE TO REPORT DUE TO LIPEMIC INTERFERENCE 88.1  PLT 193 147*   Basic Metabolic Panel:  Lab Results  Component Value Date   NA 136 02/14/2024   K 3.8 02/14/2024   CO2 20 (L) 02/14/2024   GLUCOSE 228 (H) 02/14/2024   BUN 12 02/14/2024   CREATININE 0.59 02/14/2024   CALCIUM  8.6 (L) 02/14/2024   GFRNONAA >60 02/14/2024   GFRAA >60 07/13/2017   Lipid Panel:  Lab Results  Component Value  Date   LDLCALC UNABLE TO CALCULATE IF TRIGLYCERIDE OVER 400 mg/dL 16/07/9603   [id number]:  Lab Results  Component Value Date   HGBA1C 10.6 (H) 02/14/2024   Urine Drug Screen: No results found for: "LABOPIA", "COCAINSCRNUR", "LABBENZ", "AMPHETMU", "THCU", "LABBARB"  Alcohol Level     Component Value Date/Time   ETH UNABLE TO REPORT DUE TO LIPEMIC INTERFERENCE 09/05/2023 1817     MRI Brain(Personally reviewed): Negative  CTA head and neck-negative, venous sinuses are visualized and appear open  ASSESSMENT   Robyn Rivera is a 48 y.o. female with a history of several stroke risk factors who presented with focal neurological deficits with stroke suspected, but with negative MRI I think this is less  likely.  My suspicion is that this likely represents complicated migraine and I would favor treating it as such for now.  With splitting the midline of the forehead, I do have some concern there may be some degree of embellishment, and therefore if her symptoms do not improve with migraine cocktail may need to consider ocular pathology with superimposed embellishment.  RECOMMENDATIONS  Migraine cocktail with Compazine/Benadryl /Toradol  If no improvement by the morning, could repeat with Compazine/magnesium /Depakote but also would check intra ocular pressures(no objective findings suggestive of angle-closure glaucoma and low suspicion at this time) Neurology will follow  ______________________________________________________________________    Signed, Ann Keto, MD [facility]

## 2024-02-15 ENCOUNTER — Telehealth (HOSPITAL_COMMUNITY): Payer: Self-pay | Admitting: Pharmacy Technician

## 2024-02-15 ENCOUNTER — Other Ambulatory Visit (HOSPITAL_COMMUNITY): Payer: Self-pay

## 2024-02-15 ENCOUNTER — Observation Stay (HOSPITAL_COMMUNITY)

## 2024-02-15 DIAGNOSIS — H532 Diplopia: Secondary | ICD-10-CM | POA: Diagnosis not present

## 2024-02-15 DIAGNOSIS — G43109 Migraine with aura, not intractable, without status migrainosus: Secondary | ICD-10-CM | POA: Diagnosis not present

## 2024-02-15 LAB — BASIC METABOLIC PANEL WITH GFR
Anion gap: 9 (ref 5–15)
BUN: 12 mg/dL (ref 6–20)
CO2: 18 mmol/L — ABNORMAL LOW (ref 22–32)
Calcium: 8.6 mg/dL — ABNORMAL LOW (ref 8.9–10.3)
Chloride: 99 mmol/L (ref 98–111)
Creatinine, Ser: 0.69 mg/dL (ref 0.44–1.00)
GFR, Estimated: >60 mL/min (ref >60)
Glucose, Bld: 210 mg/dL — ABNORMAL HIGH (ref 70–99)
Potassium: 3.4 mmol/L — ABNORMAL LOW (ref 3.5–5.1)
Sodium: 126 mmol/L — ABNORMAL LOW (ref 135–145)

## 2024-02-15 LAB — CBC
Hemoglobin: 12 g/dL (ref 12.0–15.0)
Platelets: 116 10*3/uL — ABNORMAL LOW (ref 150–400)
WBC: 4.2 10*3/uL (ref 4.0–10.5)

## 2024-02-15 LAB — LIPID PANEL
Cholesterol: 784 mg/dL — ABNORMAL HIGH (ref 0–200)
LDL Cholesterol: UNDETERMINED mg/dL (ref 0–99)
Triglycerides: >5000 mg/dL — ABNORMAL HIGH (ref <150)
VLDL: UNDETERMINED mg/dL (ref 0–40)

## 2024-02-15 LAB — GLUCOSE, CAPILLARY
Glucose-Capillary: 232 mg/dL — ABNORMAL HIGH (ref 70–99)
Glucose-Capillary: 286 mg/dL — ABNORMAL HIGH (ref 70–99)
Glucose-Capillary: 215 mg/dL — ABNORMAL HIGH (ref 70–99)

## 2024-02-15 LAB — LDL CHOLESTEROL, DIRECT: Direct LDL: 76 mg/dL (ref 0–99)

## 2024-02-15 MED ORDER — KETOROLAC TROMETHAMINE 30 MG/ML IJ SOLN
15.0000 mg | Freq: Once | INTRAMUSCULAR | Status: AC
  Administered 2024-02-15: 15 mg via INTRAVENOUS
  Filled 2024-02-15: qty 1

## 2024-02-15 MED ORDER — PROCHLORPERAZINE EDISYLATE 10 MG/2ML IJ SOLN
10.0000 mg | Freq: Once | INTRAMUSCULAR | Status: AC
  Administered 2024-02-15: 10 mg via INTRAVENOUS
  Filled 2024-02-15: qty 2

## 2024-02-15 MED ORDER — ATORVASTATIN CALCIUM 80 MG PO TABS
80.0000 mg | ORAL_TABLET | Freq: Every day | ORAL | 0 refills | Status: DC
  Filled 2024-02-15: qty 90, 90d supply, fill #0

## 2024-02-15 MED ORDER — SODIUM CHLORIDE 0.9 % IV BOLUS
500.0000 mL | Freq: Once | INTRAVENOUS | Status: AC
  Administered 2024-02-15: 500 mL via INTRAVENOUS

## 2024-02-15 MED ORDER — FENOFIBRATE 160 MG PO TABS
160.0000 mg | ORAL_TABLET | Freq: Every day | ORAL | 0 refills | Status: DC
  Filled 2024-02-15: qty 30, 30d supply, fill #0

## 2024-02-15 MED ORDER — MAGNESIUM OXIDE -MG SUPPLEMENT 400 (240 MG) MG PO TABS
400.0000 mg | ORAL_TABLET | Freq: Every day | ORAL | Status: DC
  Administered 2024-02-15: 400 mg via ORAL
  Filled 2024-02-15: qty 1

## 2024-02-15 MED ORDER — POTASSIUM CHLORIDE CRYS ER 20 MEQ PO TBCR
40.0000 meq | EXTENDED_RELEASE_TABLET | Freq: Once | ORAL | Status: AC
  Administered 2024-02-15: 40 meq via ORAL
  Filled 2024-02-15: qty 2

## 2024-02-15 MED ORDER — LANCET DEVICE MISC
1.0000 | Freq: Three times a day (TID) | 0 refills | Status: AC
  Filled 2024-02-15: qty 1, 30d supply, fill #0

## 2024-02-15 MED ORDER — MAGNESIUM OXIDE -MG SUPPLEMENT 400 (240 MG) MG PO TABS
400.0000 mg | ORAL_TABLET | Freq: Every day | ORAL | 0 refills | Status: AC
  Filled 2024-02-15: qty 90, 90d supply, fill #0

## 2024-02-15 MED ORDER — GLIPIZIDE 5 MG PO TABS
5.0000 mg | ORAL_TABLET | Freq: Every day | ORAL | 0 refills | Status: DC
  Filled 2024-02-15: qty 90, 90d supply, fill #0

## 2024-02-15 MED ORDER — METFORMIN HCL 850 MG PO TABS
850.0000 mg | ORAL_TABLET | Freq: Two times a day (BID) | ORAL | 0 refills | Status: DC
  Filled 2024-02-15: qty 180, 90d supply, fill #0

## 2024-02-15 MED ORDER — VALPROATE SODIUM 100 MG/ML IV SOLN
250.0000 mg | Freq: Once | INTRAVENOUS | Status: AC
  Administered 2024-02-15: 250 mg via INTRAVENOUS
  Filled 2024-02-15: qty 2.5

## 2024-02-15 MED ORDER — LANCETS MISC. MISC: 1.0000 | Freq: Three times a day (TID) | Status: AC

## 2024-02-15 MED ORDER — DIPHENHYDRAMINE HCL 50 MG/ML IJ SOLN
25.0000 mg | Freq: Once | INTRAMUSCULAR | Status: DC
  Filled 2024-02-15: qty 1

## 2024-02-15 MED ORDER — SODIUM CHLORIDE 1 G PO TABS
2.0000 g | ORAL_TABLET | Freq: Once | ORAL | Status: AC
  Administered 2024-02-15: 2 g via ORAL
  Filled 2024-02-15: qty 2

## 2024-02-15 MED ORDER — OMEGA-3-ACID ETHYL ESTERS 1 G PO CAPS
2.0000 g | ORAL_CAPSULE | Freq: Two times a day (BID) | ORAL | 0 refills | Status: DC
  Filled 2024-02-15: qty 120, 30d supply, fill #0

## 2024-02-15 MED ORDER — MAGNESIUM SULFATE 2 GM/50ML IV SOLN
2.0000 g | Freq: Once | INTRAVENOUS | Status: AC
  Administered 2024-02-15: 2 g via INTRAVENOUS
  Filled 2024-02-15: qty 50

## 2024-02-15 NOTE — Telephone Encounter (Signed)
 Pharmacy Patient Advocate Encounter  Received notification from [facility] that Prior Authorization for FreeStyle Libre 3 Plus Sensor has been APPROVED from 02/15/2024 to 08/13/2024. Ran test claim, Copay is $0.00. This test claim was processed through [facility]- copay amounts may vary at other pharmacies due to pharmacy/plan contracts, or as the patient moves through the different stages of their insurance plan.   PA #/Case ID/Reference #: [id number] Key: Artie Bimler

## 2024-02-15 NOTE — Progress Notes (Signed)
 OT Cancellation Note  Patient Details Name: Robyn Rivera MRN: [id number] DOB: 09/30/1976   Cancelled Treatment:    Reason Eval/Treat Not Completed: OT screened, no needs identified, will sign off pt managing ADLs/mobility without assist, noted MRI negative. No formal OT eval needed at this time. Please reconsult if needs change.   Kiernan Farkas 02/15/2024, 2:30 PM

## 2024-02-15 NOTE — Inpatient Diabetes Management (Addendum)
 Inpatient Diabetes Program Recommendations  AACE/ADA: New Consensus Statement on Inpatient Glycemic Control (2015)  Target Ranges:  Prepandial:   less than 140 mg/dL      Peak postprandial:   less than 180 mg/dL (1-2 hours)      Critically ill patients:  140 - 180 mg/dL   Lab Results  Component Value Date   GLUCAP 286 (H) 02/15/2024   HGBA1C 10.6 (H) 02/14/2024    Review of Glycemic Control  Latest Reference Range & Units 02/14/24 10:17 02/14/24 21:29 02/15/24 08:05 02/15/24 12:02  Glucose-Capillary 70 - 99 mg/dL 098 (H) 119 (H) 147 (H) 286 (H)   Diabetes history: DM 2 Outpatient Diabetes medications:  Metformin  850 mg bid Current orders for Inpatient glycemic control:  Novolog 0-15 units tid with meals and HS  Inpatient Diabetes Program Recommendations:    Please consider adding Semglee 12 units daily while in the hospital.  A1C 10.6%- may need insulin  at discharge.   Addendum 1400-  spoke with patient regarding DM management and elevated A1C.  She states that blood sugars were doing better earlier in the year (A1C=6.8%), however A1C in April and this admit continue to increase.  She states she is supposed to be checking blood sugars bid however she "hates needles".  Discussed potential for insulin  and she states that she is afraid of needles but will take if needed.  Pre-auth done for CGM and permission obtained from MD for application of FS libre 3 plus (0$) copay.  Also reviewed plate method and the role of CHO on both blood sugars and triglycerides.  Patient appreciative of information.  She wants to wear CGM and f/u with PCP regarding potential for insulin  start?  She is discharging this afternoon.    MD ordered application of Freestyle CGM at discharge for patient. Education done regarding application and changing CGM sensor (alternate every 15 days on back of arms), 1 hour warm-up, use of glucometer when alert displays, how to scan CGM for glucose reading and information for PCP.  Patient has also been given educational packet regarding use CGM sensor including the 1-800 toll free number for any questions, problems or needs related to the [facility] sensors or reader.    Sensor applied by patient to (L) Arm at 1430.  Explained that glucose readings will not be available until 1 hour after application. Reviewed use of CGM including how to scan, changing Sensor, Vitamin C warning, arrows with glucose readings, and Freestyle app.        Encouraged patient to follow- up with PCP within the week for further adjustments to DM medications. She verbalized understanding.   Thanks,  Josefa Ni, RN, BC-ADM Inpatient Diabetes Coordinator Pager [phone]  (8a-5p)

## 2024-02-15 NOTE — Evaluation (Signed)
 Physical Therapy Brief Evaluation and Discharge Note Patient Details Name: Robyn Rivera MRN: [id number] DOB: 08-Oct-1976 Today's Date: 02/15/2024   History of Present Illness  Robyn Rivera  is a 48 y.o. female, with past medical history of type 2 diabetes mellitus, hypertriglyceridemia with acute pancreatitis in the past, patient presents to ED 02/14/24 secondary to complaints of unsteady gait, double vision, over the last 3 days, patient reports she woke up from sleep, with some dizziness, room spinning, double vision, pain on the left side of the head, migraine, reports symptoms did not improve which prompted her to come to ED, she denies any loss of consciousness, any tingling or numbness or focal deficits on any extremities.   Clinical Impression  Patient received in bed, she is agreeable to PT assessment. Patient is independent with all activities at this time. Ambulated 150 feet without AD and supervision only. She is independent at this time. No skilled needs identified. Signing off.         PT Assessment  No skilled needs  Assistance Needed at Discharge   N/a    Equipment Recommendations None recommended by PT  Recommendations for Other Services       Precautions/Restrictions Precautions Precautions: None Restrictions Weight Bearing Restrictions Per Provider Order: No        Mobility  Bed Mobility          Transfers Overall transfer level: Independent                      Ambulation/Gait Ambulation/Gait assistance: Independent Gait Distance (Feet): 150 Feet Assistive device: None Gait Pattern/deviations: Step-through pattern   General Gait Details: no difficulties with ambulation on unit  Home Activity Instructions    Stairs            Modified Rankin (Stroke Patients Only)        Balance                          Pertinent Vitals/Pain   Pain Assessment Pain Assessment: No/denies pain     Home Living   Living  Arrangements: Spouse/significant other       Home Equipment: None   Additional Comments: patient lives at home with her fiance who can assist her as needed for ADLs. has two adult children and two teens at home    Prior Function        UE/LE Assessment               Communication   Communication Communication: No apparent difficulties     Cognition         General Comments      Exercises     Assessment/Plan    PT Problem List         PT Visit Diagnosis      No Skilled PT Patient is independent with all acitivity/mobility   Co-evaluation                AMPAC 6 Clicks Help needed turning from your back to your side while in a flat bed without using bedrails?: None Help needed moving from lying on your back to sitting on the side of a flat bed without using bedrails?: None Help needed moving to and from a bed to a chair (including a wheelchair)?: None Help needed standing up from a chair using your arms (e.g., wheelchair or bedside chair)?: None Help needed to walk in hospital room?: None Help  needed climbing 3-5 steps with a railing? : None 6 Click Score: 24      End of Session   Activity Tolerance: Patient tolerated treatment well Patient left: in bed;with call bell/phone within reach Nurse Communication: Mobility status       Time: 4098-1191 PT Time Calculation (min) (ACUTE ONLY): 8 min  Charges:   PT Evaluation $PT Eval Low Complexity: 1 Low      Janney Priego, PT, GCS 02/15/24,10:08 AM

## 2024-02-15 NOTE — Telephone Encounter (Signed)
 Patient Product/process development scientist completed.    The patient is insured through [facility].     Ran test claim for Lantus Pen and the current 30 day co-pay is $4.00.  Ran test claim for Dexcom G7 Sensor and Requires Prior Authorization  Ran test claim for [employer] 3 Plus Sensor and Requires Prior Authorization  This test claim was processed through [employer]- copay amounts may vary at other pharmacies due to [employer], or as the patient moves through the different stages of their insurance plan.     Morgan Arab, CPHT Pharmacy Technician III Certified Patient Advocate [facility] Pharmacy Patient Advocate Team Direct Number: [phone]  Fax: [phone]

## 2024-02-15 NOTE — Progress Notes (Signed)
   02/15/24 1135  TOC Brief Assessment  Insurance and Status Reviewed ([place] Medicaid [place])  Patient has primary care physician Yes Le Primes, Chriss Coup, FNP)  Home environment has been reviewed From home  Prior level of function: Independent  Prior/Current Home Services No current home services  Social Drivers of Health Review SDOH reviewed no interventions necessary  Readmission risk has been reviewed Yes (N/A)  Transition of care needs no transition of care needs at this time   No TOC needs identified at this time  Please place [facility] consult if needs do arise

## 2024-02-15 NOTE — TOC Transition Note (Signed)
 Transition of Care [facility]) - Discharge Note   Patient Details  Name: Robyn Rivera MRN: [id number] Date of Birth: 12-05-1975  Transition of Care [facility]) CM/SW Contact:  Eusebio High, RN Phone Number: 02/15/2024, 3:57 PM   Clinical Narrative:     Patient to DC to home today. No TOC needs Patient will follow up as directed on AVS.           Patient Goals and CMS Choice            Discharge Placement                       Discharge Plan and Services Additional resources added to the After Visit Summary for                                       Social Drivers of Health (SDOH) Interventions SDOH Screenings   Food Insecurity: No Food Insecurity (02/14/2024)  Housing: Unknown (02/14/2024)  Transportation Needs: No Transportation Needs (02/14/2024)  Utilities: Not At Risk (02/14/2024)  Alcohol Screen: Low Risk  (09/13/2023)  Depression (PHQ2-9): Medium Risk (09/13/2023)  Physical Activity: Inactive (09/13/2023)  Tobacco Use: Low Risk  (02/13/2024)     Readmission Risk Interventions    09/08/2023    3:39 PM  Readmission Risk Prevention Plan  Post Dischage Appt Not Complete  Appt Comments Patient does not have insurance however will assist closer to time of discharge  Medication Screening Complete  Transportation Screening Complete

## 2024-02-15 NOTE — TOC CM/SW Note (Signed)
 Transition of Care [facility]) - Inpatient Brief Assessment   Patient Details  Name: Caroline Twiddy MRN: [id number] Date of Birth: 1975-10-26  Transition of Care [facility]) CM/SW Contact:    Jannice Mends, LCSW Phone Number: 02/15/2024, 3:01 PM   Clinical Narrative: CSW received request to provide counseling resources for patient. CSW spoke with patient and provided list of community counseling options in network for patient. She reported understanding and will follow up.    Transition of Care Asessment: Insurance and Status: Insurance coverage has been reviewed ([place] Medicaid [place]) Patient has primary care physician: Yes Le Primes, Chriss Coup, FNP) Home environment has been reviewed: From home Prior level of function:: Independent Prior/Current Home Services: No current home services Social Drivers of Health Review: SDOH reviewed no interventions necessary Readmission risk has been reviewed: Yes (N/A) Transition of care needs: no transition of care needs at this time

## 2024-02-15 NOTE — Discharge Instructions (Addendum)
 Counseling Resources: [facility] [address] [place], [place] [address] [phone] Accepts [facility]  [facility] [address], [address]  [place], [place] [address]        [phone]     [facility]  [phone] [address] Bryon Caraway  [place], [place] [address] [phone]  [facility] [address], [place], [place]  [phone]    [facility] [address], [place], [place] [phone]  [facility]:  [phone] [place]  [facility]:  [address] [place], [place] [phone]  *[facility] (under & uninsured) [address]   [place] [phone]    [email or web address]    [facility] [place] [address]     Phone:  [phone] [facility]-  [address]  [phone]    [facility] [address] [place], [place] [phone]  *[facility]: [address] [place], [place], [place]   [phone]   [facility] (Spanish therapist) [address] [place] [address] [phone]    [facility]  [phone]  [address],  [place], [place]    [place] [phone] [address] [place]   [facility]  [address] [place], [place]        [phone]  [facility] [place], [address] [place] [phone]):   Mon - Fri from 8 AM - 3 PM  [facility] the [address], [address] , [place]: [phone]) 8:30 - 12; 1 - 2:30 Accepts Medicaid   [facility] of the [employer], [address] [place], [place] ([phone]):8:30 - 12; 2 - 3PM  [facility] [employer], [address] [place]; [phone]):   Mon - Fri 8 AM - 5 PM  *[facility] [address] [place]  MWF 12:30 to 3:00 or  call to schedule an appointment [phone]  Specific Provider options Psychology Today  https://www.psychologytoday.com/us  click on find a therapist  enter your zip code left side and select or tailor a therapist for your specific need.   [facility] Provider Directory [email or web address]  (Medicaid)  Follow all drop down to find a provider  Social Support program [facility] [place] [phone] or [employer] [address], [place], [place] Recovery support and educational  In home counseling [facility] Telephone: [phone]  office in [facility] [email or web address]   private insurance [employer], [place] health Choice, [place], [place], [place], [place], [place] Health Choice   24- Hour Availability:  [facility]  [phone] or [phone]  [facility] Service of the [facility] [phone]  [facility] [place] [facility] Crisis Service  [phone]   [facility]  [phone] (after hours)  Therapeutic Alternative/Mobile Crisis   3432049719  [place]  National Suicide Hotline  [phone] Derrel Flies)  Call 911 or go to emergency room  [facility]  [phone]);  [place] and [employer]

## 2024-02-15 NOTE — Progress Notes (Signed)
 SLP Cancellation Note  Patient Details Name: Robyn Rivera MRN: [id number] DOB: 13-Feb-1976   Cancelled treatment:       Reason Eval/Treat Not Completed: SLP screened, no needs identified, will sign off.   Amil Kale, M.A., CCC-SLP Speech Language Pathology, Acute Rehabilitation Services  Secure Chat preferred [phone]  02/15/2024, 4:49 PM

## 2024-02-15 NOTE — Plan of Care (Signed)
 Problem: Education: Goal: Knowledge of General Education information will improve Description: Including pain rating scale, medication(s)/side effects and non-pharmacologic comfort measures 02/15/2024 0658 by Durand Gift, RN Outcome: Progressing 02/14/2024 2357 by Durand Gift, RN Outcome: Progressing   Problem: Health Behavior/Discharge Planning: Goal: Ability to manage health-related needs will improve 02/15/2024 0658 by Durand Gift, RN Outcome: Progressing 02/14/2024 2357 by Durand Gift, RN Outcome: Progressing   Problem: Clinical Measurements: Goal: Ability to maintain clinical measurements within normal limits will improve 02/15/2024 0658 by Durand Gift, RN Outcome: Progressing 02/14/2024 2357 by Durand Gift, RN Outcome: Progressing Goal: Will remain free from infection 02/15/2024 0658 by Durand Gift, RN Outcome: Progressing 02/14/2024 2357 by Durand Gift, RN Outcome: Progressing Goal: Diagnostic test results will improve 02/15/2024 0658 by Durand Gift, RN Outcome: Progressing 02/14/2024 2357 by Durand Gift, RN Outcome: Progressing Goal: Respiratory complications will improve 02/15/2024 0658 by Durand Gift, RN Outcome: Progressing 02/14/2024 2357 by Durand Gift, RN Outcome: Progressing Goal: Cardiovascular complication will be avoided 02/15/2024 0658 by Durand Gift, RN Outcome: Progressing 02/14/2024 2357 by Durand Gift, RN Outcome: Progressing   Problem: Activity: Goal: Risk for activity intolerance will decrease 02/15/2024 0658 by Durand Gift, RN Outcome: Progressing 02/14/2024 2357 by Durand Gift, RN Outcome: Progressing   Problem: Nutrition: Goal: Adequate nutrition will be maintained 02/15/2024 0658 by Durand Gift, RN Outcome: Progressing 02/14/2024 2357 by Durand Gift, RN Outcome: Progressing   Problem: Coping: Goal: Level  of anxiety will decrease 02/15/2024 0658 by Durand Gift, RN Outcome: Progressing 02/14/2024 2357 by Durand Gift, RN Outcome: Progressing   Problem: Elimination: Goal: Will not experience complications related to bowel motility 02/15/2024 0658 by Durand Gift, RN Outcome: Progressing 02/14/2024 2357 by Durand Gift, RN Outcome: Progressing Goal: Will not experience complications related to urinary retention 02/15/2024 0658 by Durand Gift, RN Outcome: Progressing 02/14/2024 2357 by Durand Gift, RN Outcome: Progressing   Problem: Pain Managment: Goal: General experience of comfort will improve and/or be controlled 02/15/2024 0658 by Durand Gift, RN Outcome: Progressing 02/14/2024 2357 by Durand Gift, RN Outcome: Progressing   Problem: Safety: Goal: Ability to remain free from injury will improve 02/15/2024 0658 by Durand Gift, RN Outcome: Progressing 02/14/2024 2357 by Durand Gift, RN Outcome: Progressing   Problem: Skin Integrity: Goal: Risk for impaired skin integrity will decrease 02/15/2024 0658 by Durand Gift, RN Outcome: Progressing 02/14/2024 2357 by Durand Gift, RN Outcome: Progressing   Problem: Education: Goal: Ability to describe self-care measures that may prevent or decrease complications (Diabetes Survival Skills Education) will improve 02/15/2024 0658 by Durand Gift, RN Outcome: Progressing 02/14/2024 2357 by Durand Gift, RN Outcome: Progressing Goal: Individualized Educational Video(s) 02/15/2024 0658 by Durand Gift, RN Outcome: Progressing 02/14/2024 2357 by Durand Gift, RN Outcome: Progressing   Problem: Coping: Goal: Ability to adjust to condition or change in health will improve 02/15/2024 0658 by Durand Gift, RN Outcome: Progressing 02/14/2024 2357 by Durand Gift, RN Outcome: Progressing   Problem: Fluid Volume: Goal:  Ability to maintain a balanced intake and output will improve 02/15/2024 0658 by Durand Gift, RN Outcome: Progressing 02/14/2024 2357 by Durand Gift, RN Outcome: Progressing   Problem: Health Behavior/Discharge Planning: Goal: Ability to identify and utilize available resources and services will improve 02/15/2024 0658 by Durand Gift, RN Outcome: Progressing 02/14/2024 2357 by  Durand Gift, RN Outcome: Progressing Goal: Ability to manage health-related needs will improve 02/15/2024 0658 by Durand Gift, RN Outcome: Progressing 02/14/2024 2357 by Durand Gift, RN Outcome: Progressing   Problem: Metabolic: Goal: Ability to maintain appropriate glucose levels will improve 02/15/2024 0658 by Durand Gift, RN Outcome: Progressing 02/14/2024 2357 by Durand Gift, RN Outcome: Progressing   Problem: Nutritional: Goal: Maintenance of adequate nutrition will improve 02/15/2024 0658 by Durand Gift, RN Outcome: Progressing 02/14/2024 2357 by Durand Gift, RN Outcome: Progressing Goal: Progress toward achieving an optimal weight will improve 02/15/2024 0658 by Durand Gift, RN Outcome: Progressing 02/14/2024 2357 by Durand Gift, RN Outcome: Progressing   Problem: Skin Integrity: Goal: Risk for impaired skin integrity will decrease 02/15/2024 0658 by Durand Gift, RN Outcome: Progressing 02/14/2024 2357 by Durand Gift, RN Outcome: Progressing   Problem: Tissue Perfusion: Goal: Adequacy of tissue perfusion will improve 02/15/2024 0658 by Durand Gift, RN Outcome: Progressing 02/14/2024 2357 by Durand Gift, RN Outcome: Progressing   Problem: Education: Goal: Knowledge of disease or condition will improve 02/15/2024 0658 by Durand Gift, RN Outcome: Progressing 02/14/2024 2357 by Durand Gift, RN Outcome: Progressing Goal: Knowledge of secondary prevention will  improve (MUST DOCUMENT ALL) 02/15/2024 0658 by Durand Gift, RN Outcome: Progressing 02/14/2024 2357 by Durand Gift, RN Outcome: Progressing Goal: Knowledge of patient specific risk factors will improve (DELETE if not current risk factor) 02/15/2024 0658 by Durand Gift, RN Outcome: Progressing 02/14/2024 2357 by Durand Gift, RN Outcome: Progressing   Problem: Ischemic Stroke/TIA Tissue Perfusion: Goal: Complications of ischemic stroke/TIA will be minimized 02/15/2024 0658 by Durand Gift, RN Outcome: Progressing 02/14/2024 2357 by Durand Gift, RN Outcome: Progressing   Problem: Coping: Goal: Will verbalize positive feelings about self 02/15/2024 0658 by Durand Gift, RN Outcome: Progressing 02/14/2024 2357 by Durand Gift, RN Outcome: Progressing Goal: Will identify appropriate support needs 02/15/2024 0658 by Durand Gift, RN Outcome: Progressing 02/14/2024 2357 by Durand Gift, RN Outcome: Progressing   Problem: Health Behavior/Discharge Planning: Goal: Ability to manage health-related needs will improve 02/15/2024 0658 by Durand Gift, RN Outcome: Progressing 02/14/2024 2357 by Durand Gift, RN Outcome: Progressing Goal: Goals will be collaboratively established with patient/family 02/15/2024 [phone] by Durand Gift, RN Outcome: Progressing 02/14/2024 2357 by Durand Gift, RN Outcome: Progressing   Problem: Self-Care: Goal: Ability to participate in self-care as condition permits will improve 02/15/2024 0658 by Durand Gift, RN Outcome: Progressing 02/14/2024 2357 by Durand Gift, RN Outcome: Progressing Goal: Verbalization of feelings and concerns over difficulty with self-care will improve 02/15/2024 0658 by Durand Gift, RN Outcome: Progressing 02/14/2024 2357 by Durand Gift, RN Outcome: Progressing Goal: Ability to communicate needs accurately will  improve 02/15/2024 0658 by Durand Gift, RN Outcome: Progressing 02/14/2024 2357 by Durand Gift, RN Outcome: Progressing   Problem: Nutrition: Goal: Risk of aspiration will decrease 02/15/2024 0658 by Durand Gift, RN Outcome: Progressing 02/14/2024 2357 by Durand Gift, RN Outcome: Progressing Goal: Dietary intake will improve 02/15/2024 0658 by Durand Gift, RN Outcome: Progressing 02/14/2024 2357 by Durand Gift, RN Outcome: Progressing

## 2024-02-15 NOTE — Discharge Summary (Signed)
 Physician Discharge Summary  Robyn Rivera ZOX:[id number] DOB: 04-13-1976 DOA: 02/13/2024  PCP: Mickiel Albany, FNP  Admit date: 02/13/2024 Discharge date: 02/15/2024  Admitted From: (Home) Disposition:  (Home)  Recommendations for Outpatient Follow-up:  Follow up with PCP in 1 weeks Please obtain BMP/CBC in one week Referral to lipid clinic at [facility] has been made, they will call her with an appointment Patient was given resources to contact regarding talk therapy Adjust diabetic regimen as needed, she was started on glipizide  for uncontrolled diabetes mellitus  Diet: Carb  Brief/Interim Summary: Robyn Rivera  is a 48 y.o. female, with past medical history of type 2 diabetes mellitus, hypertriglyceridemia with acute pancreatitis in the past, patient presents to ED secondary to complaints of unsteady gait, double vision, over the last 3 days, patient reports she woke up from sleep, with some dizziness, room spinning, double vision, pain on the left side of the head, migraine, reports symptoms did not improve which prompted her to come to ED, she denies any loss of consciousness, any tingling or numbness or focal deficits on any extremities.  She was admitted for further workup.  Double vision, headache/complicated migraine -MRI has been negative for any acute CVA, CTA head and neck with no evidence of LVO, no significant events on telemetry,. - Findings are more consistent with complicated migraine, and stress related than actual acute ischemic event as discussed with neurology. - He was ribbing migraine cocktail during hospital stay with much improvement of her symptoms, she received Compazine, Benadryl  and Depakote. - She will be discharged on magnesium  oxide daily to help with mood and migraine.  Depression -Patient with stressful events in her life recently, would like to pursue talk therapy, TOC provided resources in her community  Diabetes mellitus, type II, uncontrolled - A1c  significantly elevated at 10.8, she was counseled about diet and medication compliance with reports she has been, will add glipizide  to her metformin , she was seen by diabetic coordinator, Dexcom was initiated, to follow-up with her PCP as an outpatient regarding initiation of insulin  therapy if no improvement.  Hyper triglyceridemia - Triglycerides  significantly elevated at> 5000, patient reports she has been only on fish oil, she has been giving refill for fenofibrate  and statin with instruction to resume, discussed with cardiology coordinator, they will arrange for follow-up appointment with lipid clinic in [facility]  Hyponatremia - I think this is most likely false hyponatremia in the setting of her hypertriglyceridemia.  Discharge Diagnoses:  Principal Problem:   Double vision Active Problems:   Hypertriglyceridemia   Hyponatremia   Diabetes mellitus without complication [facility])    Discharge Instructions  Discharge Instructions     Diet - low sodium heart healthy   Complete by: As directed    Discharge instructions   Complete by: As directed    Follow with Primary MD Mickiel Albany, FNP in 7 days   Get CBC, CMP,  checked  by Primary MD next visit.    Activity: As tolerated with Full fall precautions use walker/cane & assistance as needed   Disposition Home    Diet: Heart Healthy/carb modified   On your next visit with your primary care physician please Get Medicines reviewed and adjusted.   Please request your Prim.MD to go over all Hospital Tests and Procedure/Radiological results at the follow up, please get all Hospital records sent to your Prim MD by signing hospital release before you go home.   If you experience worsening of your admission symptoms, develop shortness  of breath, life threatening emergency, suicidal or homicidal thoughts you must seek medical attention immediately by calling 911 or calling your MD immediately  if symptoms less severe.  You  Must read complete instructions/literature along with all the possible adverse reactions/side effects for all the Medicines you take and that have been prescribed to you. Take any new Medicines after you have completely understood and accpet all the possible adverse reactions/side effects.   Do not drive, operating heavy machinery, perform activities at heights, swimming or participation in water activities or provide baby sitting services if your were admitted for syncope or siezures until you have seen by Primary MD or a Neurologist and advised to do so again.  Do not drive when taking Pain medications.    Do not take more than prescribed Pain, Sleep and Anxiety Medications  Special Instructions: If you have smoked or chewed Tobacco  in the last 2 yrs please stop smoking, stop any regular Alcohol  and or any Recreational drug use.  Wear Seat belts while driving.   Please note  You were cared for by a hospitalist during your hospital stay. If you have any questions about your discharge medications or the care you received while you were in the hospital after you are discharged, you can call the unit and asked to speak with the hospitalist on call if the hospitalist that took care of you is not available. Once you are discharged, your primary care physician will handle any further medical issues. Please note that NO REFILLS for any discharge medications will be authorized once you are discharged, as it is imperative that you return to your primary care physician (or establish a relationship with a primary care physician if you do not have one) for your aftercare needs so that they can reassess your need for medications and monitor your lab values.   Increase activity slowly   Complete by: As directed       Allergies as of 02/15/2024       Reactions   Icy Hot Swelling, Other (See Comments)   Face puffed out/became swollen and the patient had to go to the hospital.   Penicillins Other (See  Comments)   Patient states "when I was little, I almost died."  Was tried on a very low dose later in life and tolerate that.        Medication List     STOP taking these medications    ibuprofen  200 MG tablet Commonly known as: ADVIL    meloxicam  15 MG tablet Commonly known as: MOBIC        TAKE these medications    acetaminophen  325 MG tablet Commonly known as: TYLENOL  Take 2 tablets (650 mg total) by mouth every 6 (six) hours as needed for mild pain (pain score 1-3) (or Fever >/= 101). What changed: Another medication with the same name was removed. Continue taking this medication, and follow the directions you see here.   Artificial Tears PF 0.1-0.3 % Soln Generic drug: Dextran 70-Hypromellose (PF) Place 1 drop into both eyes 3 (three) times daily as needed (for dryness).   atorvastatin  80 MG tablet Commonly known as: LIPITOR Take 1 tablet (80 mg total) by mouth daily.   Blood Glucose Monitoring Suppl Devi 1 each by Does not apply route in the morning, at noon, and at bedtime. May substitute to any manufacturer covered by patient's insurance.   BLOOD GLUCOSE TEST STRIPS Strp 1 each by In Vitro route in the morning, at noon, and  at bedtime. May substitute to any manufacturer covered by patient's insurance.   fenofibrate  160 MG tablet Take 1 tablet (160 mg total) by mouth daily.   glipiZIDE  5 MG tablet Commonly known as: GLUCOTROL  Take 1 tablet (5 mg total) by mouth daily before breakfast.   Lancet Device Misc 1 each by Does not apply route in the morning, at noon, and at bedtime. May substitute to any manufacturer covered by patient's insurance.   Lancets Misc. Misc 1 each by Does not apply route in the morning, at noon, and at bedtime. May substitute to any manufacturer covered by patient's insurance.   magnesium  oxide 400 (240 Mg) MG tablet Commonly known as: MAG-OX Take 1 tablet (400 mg total) by mouth daily.   metFORMIN  850 MG tablet Commonly known as:  GLUCOPHAGE  Take 1 tablet (850 mg total) by mouth 2 (two) times daily with a meal.   omega-3 acid ethyl esters 1 g capsule Commonly known as: LOVAZA  Take 2 capsules (2 g total) by mouth 2 (two) times daily.        Allergies  Allergen Reactions   Icy Hot Swelling and Other (See Comments)    Face puffed out/became swollen and the patient had to go to the hospital.   Penicillins Other (See Comments)    Patient states "when I was little, I almost died."  Was tried on a very low dose later in life and tolerate that.    Consultations: Neurology   Procedures/Studies: MR BRAIN WO CONTRAST Result Date: 02/14/2024 CLINICAL DATA:  Double vision. EXAM: MRI HEAD WITHOUT CONTRAST TECHNIQUE: Multiplanar, multiecho pulse sequences of the brain and surrounding structures were obtained without intravenous contrast. COMPARISON:  CTA head/neck from Feb 13, 2024. FINDINGS: Brain: No acute infarction, hemorrhage, hydrocephalus, extra-axial collection or mass lesion. Vascular: Major arterial flow voids are maintained at the skull base. Skull and upper cervical spine: Normal marrow signal. Sinuses/Orbits: Clear sinuses.  No acute orbital findings. Other: No mastoid effusions. IMPRESSION: No evidence of acute intracranial abnormality. Electronically Signed   By: Stevenson Elbe M.D.   On: 02/14/2024 21:51   CT ANGIO HEAD NECK W WO CM Result Date: 02/13/2024 CLINICAL DATA:  Diplopia. EXAM: CT ANGIOGRAPHY HEAD AND NECK WITH AND WITHOUT CONTRAST TECHNIQUE: Multidetector CT imaging of the head and neck was performed using the standard protocol during bolus administration of intravenous contrast. Multiplanar CT image reconstructions and MIPs were obtained to evaluate the vascular anatomy. Carotid stenosis measurements (when applicable) are obtained utilizing NASCET criteria, using the distal internal carotid diameter as the denominator. RADIATION DOSE REDUCTION: This exam was performed according to the departmental  dose-optimization program which includes automated exposure control, adjustment of the mA and/or kV according to patient size and/or use of iterative reconstruction technique. CONTRAST:  75mL OMNIPAQUE  IOHEXOL  350 MG/ML SOLN COMPARISON:  None Available. FINDINGS: CT HEAD FINDINGS Brain: No evidence of acute infarction, hemorrhage, hydrocephalus, extra-axial collection or mass lesion/mass effect. Vascular: No hyperdense vessel. Skull: No acute fracture. Sinuses/Orbits: Clear sinuses.  No acute orbital findings. Other: No mastoid effusions. Review of the MIP images confirms the above findings CTA NECK FINDINGS Aortic arch: Great vessel origins are patent without significant stenosis. Right carotid system: No evidence of dissection, stenosis (50% or greater), or occlusion. Left carotid system: No evidence of dissection, stenosis (50% or greater), or occlusion. Vertebral arteries: Left dominant. No evidence of dissection, stenosis (50% or greater), or occlusion. Skeleton: No acute abnormality on limited assessment. Other neck: No acute abnormality on limited assessment.  Upper chest: Visualized lung apices are clear. Review of the MIP images confirms the above findings CTA HEAD FINDINGS Anterior circulation: Bilateral intracranial ICAs, MCAs, and ACAs are patent without proximal hemodynamically significant stenosis. Posterior circulation: Bilateral intradural vertebral arteries, basilar artery and bilateral posterior cerebral arteries are patent without proximal hemodynamically significant stenosis. Venous sinuses: As permitted by contrast timing, patent. Review of the MIP images confirms the above findings IMPRESSION: 1. No evidence of acute intracranial abnormality. 2. No large vessel occlusion or proximal hemodynamically significant stenosis. Electronically Signed   By: Stevenson Elbe M.D.   On: 02/13/2024 20:21   US  Abdomen Limited Result Date: 01/19/2024 CLINICAL DATA:  Right lower quadrant pain, assess for  hernia. EXAM: ULTRASOUND ABDOMEN LIMITED COMPARISON:  None Available. FINDINGS: No focal abnormal discrete cystic or solid lesion or hernia is identified in the right lower quadrant area of pain. IMPRESSION: No focal abnormal discrete cystic or solid lesion or hernia is identified in the right lower quadrant area of pain. Electronically Signed   By: Anna Barnes M.D.   On: 01/19/2024 11:33      Subjective: Patient reports her headache, double vision much improved  Discharge Exam: Vitals:   02/15/24 0748 02/15/24 1236  BP: 122/66 (!) 119/59  Pulse:    Resp: 10 16  Temp: 98 F (36.7 C)   SpO2:     Vitals:   02/14/24 2341 02/15/24 0321 02/15/24 0748 02/15/24 1236  BP: 126/75 119/62 122/66 (!) 119/59  Pulse: 64 67    Resp: 17 15 10 16   Temp: 97.8 F (36.6 C) 97.7 F (36.5 C) 98 F (36.7 C)   TempSrc: Oral Oral    SpO2: 100% 98%      General: Pt is alert, awake, not in acute distress Cardiovascular: RRR, S1/S2 +, no rubs, no gallops Respiratory: CTA bilaterally, no wheezing, no rhonchi Abdominal: Soft, NT, ND, bowel sounds + Extremities: no edema, no cyanosis    The results of significant diagnostics from this hospitalization (including imaging, microbiology, ancillary and laboratory) are listed below for reference.     Microbiology: No results found for this or any previous visit (from the past 240 hours).   Labs: BNP (last 3 results) No results for input(s): "BNP" in the last 8760 hours. Basic Metabolic Panel: Recent Labs  Lab 02/13/24 1743 02/14/24 0826 02/15/24 0433  NA 122* 136 126*  K 3.8 3.8 3.4*  CL 95* 111 99  CO2 17* 20* 18*  GLUCOSE 244* 228* 210*  BUN 10 12 12   CREATININE 0.75 0.59 0.69  CALCIUM  8.8* 8.6* 8.6*   Liver Function Tests: Recent Labs  Lab 02/13/24 1743  AST 76*  ALT 69*  ALKPHOS 96  BILITOT 1.1  PROT 7.5  ALBUMIN 4.2   No results for input(s): "LIPASE", "AMYLASE" in the last 168 hours. No results for input(s): "AMMONIA" in  the last 168 hours. CBC: Recent Labs  Lab 02/13/24 1743 02/14/24 1601 02/15/24 0433  WBC 6.0 5.1 4.2  NEUTROABS 3.3  --   --   HGB 17.7* 12.2 12.0  HCT UNABLE TO REPORT DUE TO LIPEMIC INTERFERENCE 34.0* RESULTS UNAVAILABLE DUE TO INTERFERING SUBSTANCE  MCV UNABLE TO REPORT DUE TO LIPEMIC INTERFERENCE 88.1 RESULTS UNAVAILABLE DUE TO INTERFERING SUBSTANCE  PLT 193 147* 116*   Cardiac Enzymes: No results for input(s): "CKTOTAL", "CKMB", "CKMBINDEX", "TROPONINI" in the last 168 hours. BNP: Invalid input(s): "POCBNP" CBG: Recent Labs  Lab 02/14/24 1017 02/14/24 2129 02/15/24 0805 02/15/24 1202  GLUCAP 227*  162* 232* 286*   D-Dimer No results for input(s): "DDIMER" in the last 72 hours. Hgb A1c Recent Labs    02/14/24 1601  HGBA1C 10.6*   Lipid Profile Recent Labs    02/14/24 1601 02/15/24 0433  CHOL 727* 784*  HDL NOT REPORTED DUE TO HIGH TRIGLYCERIDES NOT REPORTED DUE TO HIGH TRIGLYCERIDES  LDLCALC UNABLE TO CALCULATE IF TRIGLYCERIDE OVER 400 mg/dL UNABLE TO CALCULATE IF TRIGLYCERIDE OVER 400 mg/dL  TRIG >9,604* >5,409*  CHOLHDL NOT REPORTED DUE TO HIGH TRIGLYCERIDES NOT REPORTED DUE TO HIGH TRIGLYCERIDES  LDLDIRECT 70 76   Thyroid function studies No results for input(s): "TSH", "T4TOTAL", "T3FREE", "THYROIDAB" in the last 72 hours.  Invalid input(s): "FREET3" Anemia work up No results for input(s): "VITAMINB12", "FOLATE", "FERRITIN", "TIBC", "IRON", "RETICCTPCT" in the last 72 hours. Urinalysis    Component Value Date/Time   COLORURINE YELLOW 02/13/2024 2254   APPEARANCEUR CLOUDY (A) 02/13/2024 2254   LABSPEC 1.015 02/13/2024 2254   PHURINE 5.5 02/13/2024 2254   GLUCOSEU >=500 (A) 02/13/2024 2254   HGBUR TRACE (A) 02/13/2024 2254   BILIRUBINUR NEGATIVE 02/13/2024 2254   KETONESUR NEGATIVE 02/13/2024 2254   PROTEINUR NEGATIVE 02/13/2024 2254   NITRITE NEGATIVE 02/13/2024 2254   LEUKOCYTESUR NEGATIVE 02/13/2024 2254   Sepsis Labs Recent Labs  Lab  02/13/24 1743 02/14/24 1601 02/15/24 0433  WBC 6.0 5.1 4.2   Microbiology No results found for this or any previous visit (from the past 240 hours).   Time coordinating discharge: Over 30 minutes  SIGNED:   Seena Dadds, MD  [facility] 02/15/2024, 2:54 PM Pager   If 7PM-7AM, please contact night-coverage [email or web address] Password [email or web address]

## 2024-02-15 NOTE — Progress Notes (Signed)
   02/15/24 1015  Spiritual Encounters  Type of Visit Initial  Care provided to: Patient  Reason for visit Advance directives  OnCall Visit No   Provided education for HCPOA, Left paperwork with patient to review and consider. Patient's spouse will act as agent.

## 2024-02-15 NOTE — Progress Notes (Signed)
 PT Cancellation Note  Patient Details Name: Robyn Rivera MRN: [id number] DOB: 1975-11-29   Cancelled Treatment:    Reason Eval/Treat Not Completed: Patient at procedure or test/unavailable Will re-attempt when patient available.   [name] 02/15/2024, 9:30 AM

## 2024-02-15 NOTE — Progress Notes (Signed)
 NEUROLOGY CONSULT FOLLOW UP NOTE   Date of service: Feb 15, 2024 Patient Name: Robyn Rivera MRN:  [id number] DOB:  [birth year]  Interval Hx/subjective   Patient sitting up in bed, NAD. Endorses improvement in headache, but still has a constant ache on left side of face/head that is a 6/10. Decreased sensation to left side of face, upper and lower areas. No confusion, no focal deficits.   She states that she has had much increased stress lately due to her son, who has special needs, moving back in with her along with the fact that someone killed her dog on Friday. Patient is very open to going to therapy to discuss these are other stressors in her life, to assist in coping mechanisms, etc.   Vitals   Vitals:   02/14/24 2341 02/15/24 0321 02/15/24 0748 02/15/24 1236  BP: 126/75 119/62 122/66 (!) 119/59  Pulse: 64 67    Resp: 17 15 10 16   Temp: 97.8 F (36.6 C) 97.7 F (36.5 C) 98 F (36.7 C)   TempSrc: Oral Oral    SpO2: 100% 98%       There is no height or weight on file to calculate BMI.  Physical Exam   Constitutional: Appears well-developed and well-nourished.  Psych: Affect appropriate to situation.  Eyes: No scleral injection.  HENT: No OP obstrucion.  Head: Normocephalic.  Cardiovascular: Normal rate and regular rhythm.  Respiratory: Effort normal, non-labored breathing.  GI: Soft.  No distension. There is no tenderness.  Skin: WDI.   Neurologic Examination   Neuro: Mental Status: Patient is awake, alert, oriented to person, place, month, year, and situation. Able to do serial addition. Naming, repetition, comprehension intact.  Patient is able to give a clear and coherent history. No signs of dysarthria, aphasia or neglect Cranial Nerves: II: Visual Fields are full. Pupils are equal, round, and reactive to light.   III,IV, VI: EOMI without ptosis or diploplia.  V: Facial sensation is reduced on left side, upper and lower VII: Facial movement is symmetric.   VIII: hearing is intact to voice X: Uvula elevates symmetrically. No dysarthria or hoarseness XI: Shoulder shrug is symmetric. XII: tongue is midline without atrophy or fasciculations.  Motor: Tone is normal. Bulk is normal. 5/5 strength was present in all four extremities.  Sensory: Sensation is symmetric to light touch in the arms and legs. Cerebellar: FNF and HKS are intact bilaterally  Medications  Current Facility-Administered Medications:    acetaminophen  (TYLENOL ) tablet 650 mg, 650 mg, Oral, Q4H PRN, 650 mg at 02/14/24 1750 **OR** acetaminophen  (TYLENOL ) 160 MG/5ML solution 650 mg, 650 mg, Per Tube, Q4H PRN **OR** acetaminophen  (TYLENOL ) suppository 650 mg, 650 mg, Rectal, Q4H PRN, Elgergawy, Ardia Kraft, MD   atorvastatin  (LIPITOR) tablet 80 mg, 80 mg, Oral, Daily, Elgergawy, Dawood S, MD, 80 mg at 02/15/24 6440   diphenhydrAMINE  (BENADRYL ) injection 25 mg, 25 mg, Intravenous, Once, Augustin Leber, MD   fenofibrate  tablet 160 mg, 160 mg, Oral, Daily, Elgergawy, Dawood S, MD, 160 mg at 02/15/24 0904   heparin injection 5,000 Units, 5,000 Units, Subcutaneous, Q8H, Elgergawy, Ardia Kraft, MD, 5,000 Units at 02/15/24 3474   insulin  aspart (novoLOG) injection 0-15 Units, 0-15 Units, Subcutaneous, TID WC, Elgergawy, Dawood S, MD, 8 Units at 02/15/24 1232   insulin  aspart (novoLOG) injection 0-5 Units, 0-5 Units, Subcutaneous, QHS, Elgergawy, Ardia Kraft, MD   magnesium  sulfate IVPB 2 g 50 mL, 2 g, Intravenous, Once, Celia Coles C, NP   omega-3 acid ethyl  esters (LOVAZA ) capsule 2 g, 2 g, Oral, BID, Elgergawy, Ardia Kraft, MD, 2 g at 02/15/24 0913   pneumococcal 20-valent conjugate vaccine (PREVNAR 20) injection 0.5 mL, 0.5 mL, Intramuscular, Prior to discharge, Elgergawy, Ardia Kraft, MD   prochlorperazine (COMPAZINE) injection 10 mg, 10 mg, Intravenous, Once, Amelie Baize, Erin C, NP   senna-docusate (Senokot-S) tablet 1 tablet, 1 tablet, Oral, QHS PRN, Elgergawy, Ardia Kraft, MD   sodium chloride   0.9 % bolus 500 mL, 500 mL, Intravenous, Once, Amelie Baize, Erin C, NP   valproate (DEPACON) 250 mg in dextrose  5 % 50 mL IVPB, 250 mg, Intravenous, Once, Audrene Lease, NP  Labs and Diagnostic Imaging   CBC:  Recent Labs  Lab 02/13/24 1743 02/14/24 1601 02/15/24 0433  WBC 6.0 5.1 4.2  NEUTROABS 3.3  --   --   HGB 17.7* 12.2 12.0  HCT UNABLE TO REPORT DUE TO LIPEMIC INTERFERENCE 34.0* RESULTS UNAVAILABLE DUE TO INTERFERING SUBSTANCE  MCV UNABLE TO REPORT DUE TO LIPEMIC INTERFERENCE 88.1 RESULTS UNAVAILABLE DUE TO INTERFERING SUBSTANCE  PLT 193 147* 116*    Basic Metabolic Panel:  Lab Results  Component Value Date   NA 126 (L) 02/15/2024   K 3.4 (L) 02/15/2024   CO2 18 (L) 02/15/2024   GLUCOSE 210 (H) 02/15/2024   BUN 12 02/15/2024   CREATININE 0.69 02/15/2024   CALCIUM  8.6 (L) 02/15/2024   GFRNONAA >60 02/15/2024   GFRAA >60 07/13/2017   Lipid Panel:  Lab Results  Component Value Date   LDLCALC UNABLE TO CALCULATE IF TRIGLYCERIDE OVER 400 mg/dL 27/25/3664   QIHK7Q:  Lab Results  Component Value Date   HGBA1C 10.6 (H) 02/14/2024   Urine Drug Screen: No results found for: "LABOPIA", "COCAINSCRNUR", "LABBENZ", "AMPHETMU", "THCU", "LABBARB"  Alcohol Level     Component Value Date/Time   ETH UNABLE TO REPORT DUE TO LIPEMIC INTERFERENCE 09/05/2023 1817   INR No results found for: "INR" APTT No results found for: "APTT" AED levels: No results found for: "PHENYTOIN", "ZONISAMIDE", "LAMOTRIGINE", "LEVETIRACETA"  CT Head without contrast(Personally reviewed): Negative  CT angio Head and Neck with contrast(Personally reviewed): No LVO or significant stenosis   MRI Brain(Personally reviewed): Negative  Assessment    Robyn Rivera is a 48 y.o. female with a history of several stroke risk factors who presented with focal neurological deficits with stroke suspected. However, her MRI was negative and stroke workup was not needed.   Patient endorses increased stressors  and poor control of her DM at home (CBGs have been elevated during her admission as well). Discussed extensively at bedside how stress and uncontrolled blood sugars can both affect your brain and cause headaches. She is open to talk therapy to assist in her stressors and in learning coping mechanisms and knows she needs to follow up with PCP for better control of her DM.   Impression: complicated Migraine exacerbated by recent increased stress  Recommendations   Repeat Migraine cocktail with compazine, Magnesium , Depakote and NS 500 bolus (ordered) Followup with PCP to discuss better control of DM Start magnesium  oxide 400mg  daily for headache and mood. Outpatient referral to talk therapy, specifically one that speaks Spanish per patient request.  Neurology will signoff. Please feel free to contact us  with any questions or concerns. ______________________________________________________________________   Cherl Corner, NP [facility]  NEUROHOSPITALIST ADDENDUM Performed a face to face diagnostic evaluation.   I have reviewed the contents of history and physical exam as documented by PA/ARNP/Resident and agree with above documentation.  I have discussed and formulated the above plan as documented. Edits to the note have been made as needed.  Chaquetta Schlottman, MD [facility] [phone]   If 7pm to 7am, please call on call as listed on AMION.

## 2024-02-18 ENCOUNTER — Encounter (HOSPITAL_COMMUNITY): Payer: Self-pay

## 2024-02-22 ENCOUNTER — Ambulatory Visit: Admitting: Family Medicine

## 2024-02-22 ENCOUNTER — Encounter: Payer: Self-pay | Admitting: Family Medicine

## 2024-02-22 VITALS — BP 116/62 | HR 78 | Temp 98.7°F | Ht 64.0 in | Wt 150.2 lb

## 2024-02-22 DIAGNOSIS — E0865 Diabetes mellitus due to underlying condition with hyperglycemia: Secondary | ICD-10-CM

## 2024-02-22 DIAGNOSIS — F411 Generalized anxiety disorder: Secondary | ICD-10-CM | POA: Diagnosis not present

## 2024-02-22 MED ORDER — ESCITALOPRAM OXALATE 10 MG PO TABS: 10.0000 mg | ORAL_TABLET | Freq: Every day | ORAL | 0 refills | Status: DC

## 2024-02-22 MED ORDER — INSULIN GLARGINE SOLOSTAR 100 UNIT/ML ~~LOC~~ SOPN
15.0000 [IU] | PEN_INJECTOR | Freq: Every day | SUBCUTANEOUS | 1 refills | Status: DC
Start: 2024-02-22 — End: 2024-04-20

## 2024-02-22 NOTE — Progress Notes (Signed)
 Established Patient Office Visit  Subjective   Patient ID: Robyn Rivera, female    DOB: [birth year]  Age: 48 y.o. MRN: [id number]  Chief Complaint  Patient presents with   Hospitalization Follow-up    Admitted to hospital 5/4-5/6 with headache and vision changes.  MRI and CT scan were negative.  A1C 10.6 at hospital.  Needs to start insulin  today. Wearing Freestyle Libre. Glucose range from 171-226. Needs diabetes education.  Referred to lipid clinic. No appointment yet.   Having low energy.  Diabetes not in control based on glucoses. Discussed blood sugar goals.  Needs referral for eye exam.  Having stress, tried marijuana and felt better. Reports vision better when she smoked marijuana.  GAD-7 score today: 21 Start Lexapro 10 mg daily.                ROS    Objective:     BP 116/62 (BP Location: Left Arm, Patient Position: Sitting, Cuff Size: Normal)   Pulse 78   Temp 98.7 F (37.1 C) (Oral)   Ht 5\' 4"  (1.626 m)   Wt 150 lb 3 oz (68.1 kg)   SpO2 98%   BMI 25.78 kg/m  BP Readings from Last 3 Encounters:  02/22/24 116/62  02/15/24 (!) 119/59  01/14/24 130/82      Physical Exam Vitals and nursing note reviewed.  Constitutional:      General: She is not in acute distress.    Appearance: Normal appearance.  Cardiovascular:     Rate and Rhythm: Normal rate.  Pulmonary:     Effort: Pulmonary effort is normal.  Skin:    General: Skin is warm and dry.  Neurological:     General: No focal deficit present.     Mental Status: She is alert. Mental status is at baseline.  Psychiatric:        Mood and Affect: Mood normal.        Behavior: Behavior normal.        Thought Content: Thought content normal.        Judgment: Judgment normal.     No results found for any visits on 02/22/24.      Last hemoglobin A1c Lab Results  Component Value Date   HGBA1C 10.6 (H) 02/14/2024      The ASCVD Risk score (Arnett DK, et al., 2019)  failed to calculate for the following reasons:   Risk score cannot be calculated because patient has a medical history suggesting prior/existing ASCVD    Assessment & Plan:   Problem List Items Addressed This Visit     Diabetes mellitus due to underlying condition with hyperglycemia, without long-term current use of insulin  (HCC) - Primary   Taking glipizide  5 mg and metformin  850 mg BID as prescribed. A1C has increased from 9.8 to 10.6 in one month. Lantus 15 units prescribed today. Blood sugar goals reviewed.  Wearing Freestyle Libre continuous glucose monitor. Blood sugar not in control. Ranges from 171-226 upon review of data.  Referral to diabetes education placed today. Referral placed for eye exam today. Follow-up in one week to assess insulin  use and to get follow-up labs.        Relevant Medications   Insulin  Glargine Solostar (LANTUS) 100 UNIT/ML Solostar Pen   Other Relevant Orders   Referral to Nutrition and Diabetes Services   Ambulatory referral to Ophthalmology   GAD (generalized anxiety disorder)   Describes anxiety as getting worse with diabetes. GAD-7 score today: 21 Start Lexapro 10  mg daily, starting with half tablet (5 mg) for first 8 days then on day 9 increase to full tablet (10 mg). Understands this will take several weeks for full therapeutic effects of this medication. May benefit from counseling in the future.  Follow-up in one week.       Relevant Medications   escitalopram (LEXAPRO) 10 MG tablet  Agrees with plan of care discussed.  Questions answered.   Return in about 1 week (around 02/29/2024) for DM for labs .    Mickiel Albany, FNP

## 2024-02-22 NOTE — Patient Instructions (Addendum)
 Target blood sugar goals:   Fasting glucose 80-130 mg/dl Postprandial glucose (40-981 minutes after the meal) less than 180 mg/dl   Start lexapro 10 mg daily. For first 8 days, take half tablet and on day 9 take full tablet.

## 2024-02-22 NOTE — Assessment & Plan Note (Signed)
 Describes anxiety as getting worse with diabetes. GAD-7 score today: 21 Start Lexapro 10 mg daily, starting with half tablet (5 mg) for first 8 days then on day 9 increase to full tablet (10 mg). Understands this will take several weeks for full therapeutic effects of this medication. May benefit from counseling in the future.  Follow-up in one week.

## 2024-02-22 NOTE — Assessment & Plan Note (Addendum)
 Taking glipizide  5 mg and metformin  850 mg BID as prescribed. A1C has increased from 9.8 to 10.6 in one month. Lantus 15 units prescribed today. Blood sugar goals reviewed.  Wearing Freestyle Libre continuous glucose monitor. Blood sugar not in control. Ranges from 171-226 upon review of data.  Referral to diabetes education placed today. Referral placed for eye exam today. Follow-up in one week to assess insulin  use and to get follow-up labs.

## 2024-02-23 ENCOUNTER — Telehealth: Payer: Self-pay

## 2024-02-23 NOTE — Telephone Encounter (Signed)
 Doctor has been updated,Thanks.

## 2024-02-23 NOTE — Telephone Encounter (Signed)
 Copied from CRM [id number]. Topic: Referral - Status >> Feb 23, 2024  1:16 PM Carlatta H wrote: Reason for CRM: Patient was referred to [facility] and they do not accept her insurance//Please send referral to another practice

## 2024-02-25 ENCOUNTER — Encounter: Admitting: Gastroenterology

## 2024-02-29 ENCOUNTER — Ambulatory Visit: Admitting: Family Medicine

## 2024-03-17 ENCOUNTER — Ambulatory Visit: Admitting: Podiatry

## 2024-03-17 DIAGNOSIS — Z91199 Patient's noncompliance with other medical treatment and regimen due to unspecified reason: Secondary | ICD-10-CM

## 2024-03-17 NOTE — Progress Notes (Signed)
 No show

## 2024-03-20 ENCOUNTER — Ambulatory Visit: Attending: Radiology | Admitting: Physical Therapy

## 2024-03-27 ENCOUNTER — Ambulatory Visit: Admitting: Physical Therapy

## 2024-03-30 ENCOUNTER — Other Ambulatory Visit: Payer: Self-pay | Admitting: Family Medicine

## 2024-03-30 DIAGNOSIS — F411 Generalized anxiety disorder: Secondary | ICD-10-CM

## 2024-04-03 ENCOUNTER — Telehealth: Payer: Self-pay | Admitting: Physical Therapy

## 2024-04-03 ENCOUNTER — Ambulatory Visit: Attending: Radiology | Admitting: Physical Therapy

## 2024-04-03 ENCOUNTER — Telehealth: Payer: Self-pay

## 2024-04-03 DIAGNOSIS — R278 Other lack of coordination: Secondary | ICD-10-CM | POA: Insufficient documentation

## 2024-04-03 DIAGNOSIS — N3946 Mixed incontinence: Secondary | ICD-10-CM | POA: Insufficient documentation

## 2024-04-03 DIAGNOSIS — M6281 Muscle weakness (generalized): Secondary | ICD-10-CM | POA: Insufficient documentation

## 2024-04-03 NOTE — Telephone Encounter (Signed)
 Prior auth for: FENOFIBRATE  160 MG Determination: APPROVED Auth #: [id number] / [id number] Valid from: 04/03/24 - 04/03/25 Patient notified via MyChart

## 2024-04-03 NOTE — Telephone Encounter (Signed)
 Left pt a message to call us  back to see if she wants to keep future scheduled PT appts. She no showed today.

## 2024-04-05 ENCOUNTER — Institutional Professional Consult (permissible substitution) (HOSPITAL_BASED_OUTPATIENT_CLINIC_OR_DEPARTMENT_OTHER): Admitting: Internal Medicine

## 2024-04-10 ENCOUNTER — Ambulatory Visit: Admitting: Physical Therapy

## 2024-04-10 ENCOUNTER — Ambulatory Visit: Admitting: Dietician

## 2024-04-10 NOTE — Telephone Encounter (Signed)
 Called pt d/t no show for her PT appt today, unable to leave a message

## 2024-04-17 ENCOUNTER — Encounter: Admitting: Physical Therapy

## 2024-04-20 ENCOUNTER — Encounter (HOSPITAL_BASED_OUTPATIENT_CLINIC_OR_DEPARTMENT_OTHER): Payer: Self-pay

## 2024-04-20 ENCOUNTER — Ambulatory Visit (INDEPENDENT_AMBULATORY_CARE_PROVIDER_SITE_OTHER): Admitting: Family Medicine

## 2024-04-20 ENCOUNTER — Encounter: Payer: Self-pay | Admitting: Family Medicine

## 2024-04-20 VITALS — BP 110/72 | HR 63 | Temp 98.6°F | Ht 63.0 in | Wt 148.0 lb

## 2024-04-20 DIAGNOSIS — E871 Hypo-osmolality and hyponatremia: Secondary | ICD-10-CM

## 2024-04-20 DIAGNOSIS — E781 Pure hyperglyceridemia: Secondary | ICD-10-CM | POA: Diagnosis not present

## 2024-04-20 DIAGNOSIS — F419 Anxiety disorder, unspecified: Secondary | ICD-10-CM | POA: Diagnosis not present

## 2024-04-20 DIAGNOSIS — E1165 Type 2 diabetes mellitus with hyperglycemia: Secondary | ICD-10-CM

## 2024-04-20 DIAGNOSIS — F32A Depression, unspecified: Secondary | ICD-10-CM

## 2024-04-20 DIAGNOSIS — E7800 Pure hypercholesterolemia, unspecified: Secondary | ICD-10-CM

## 2024-04-20 MED ORDER — ESCITALOPRAM OXALATE 10 MG PO TABS: 10.0000 mg | ORAL_TABLET | Freq: Every day | ORAL | 0 refills | Status: AC

## 2024-04-20 MED ORDER — INSULIN GLARGINE SOLOSTAR 100 UNIT/ML ~~LOC~~ SOPN: 15.0000 [IU] | PEN_INJECTOR | Freq: Every day | SUBCUTANEOUS | 1 refills | Status: DC

## 2024-04-20 MED ORDER — METFORMIN HCL 850 MG PO TABS: 850.0000 mg | ORAL_TABLET | Freq: Two times a day (BID) | ORAL | 0 refills | Status: DC

## 2024-04-20 MED ORDER — GLIPIZIDE 5 MG PO TABS: 5.0000 mg | ORAL_TABLET | Freq: Every day | ORAL | 0 refills | Status: AC

## 2024-04-20 MED ORDER — ATORVASTATIN CALCIUM 80 MG PO TABS: 80.0000 mg | ORAL_TABLET | Freq: Every day | ORAL | 0 refills | Status: AC

## 2024-04-20 MED ORDER — FENOFIBRATE 160 MG PO TABS: 160.0000 mg | ORAL_TABLET | Freq: Every day | ORAL | 0 refills | Status: AC

## 2024-04-20 MED ORDER — OMEGA-3-ACID ETHYL ESTERS 1 G PO CAPS: 2.0000 g | ORAL_CAPSULE | Freq: Two times a day (BID) | ORAL | 0 refills | Status: AC

## 2024-04-20 NOTE — Progress Notes (Signed)
 Established Patient Office Visit  Subjective   Patient ID: Robyn Rivera, female    DOB: 01/16/1976  Age: 48 y.o. MRN: [id number]  Chief Complaint  Patient presents with   Diabetes    Needs refills    HPI  Diabetes: Medication compliance: Not taking Lantus , was told the pharmacy did not have it. Taking metformin  850 mg BID an glipizide  5 mg daily.  Denies chest pain, shortness of breath, vision changes, polydipsia, polyphagia, polyuria. Denies hypoglycemia.  Pertinent lab work: A1C: 5/5 A1C 10.6 Monitoring: blood sugar readings at home: not wearing Freestyle Libre now.           Continue current medication regimen: did not start Lantus , will send to different pharmacy.   Well controlled: not well controlled.   Follow-up: 3 months,  Trying to eat better. Trying to walk more.  Has not been able to make the appointment with diabetes education. Will reschedule.    Hyperlipidemia:  Medication compliance: Taking atorvastatin  80 mg, fenofibrate  160 mg and omega -3 2000 mg BID  Denies chest pain, shortness of breath, lower extremity edema, myalgias, muscle weakness, changes in appearance of urine.  Pertinent lab work: 5/6, lipid panel not controlled, Chol 784, trigycerides > 5000. Has referral to lipid clinic.  Continue current medication regimen: ordered Lantus  15 units daily again at different pharmacy.  Follow-up: 3 months  Has an appointmnet with lipid clinic.   DepressionGAD: Taking Lexapro  10 mg most days. Recommend daily.  Symptoms are improving. PHQ-9 score today: 16 GAD-7 score: 19 No plan to harm self. Living for her son.    ROS    Objective:     BP 110/72 (BP Location: Left Arm, Patient Position: Sitting, Cuff Size: Large)   Pulse 63   Temp 98.6 F (37 C) (Oral)   Ht 5' 3 (1.6 m)   Wt 148 lb (67.1 kg)   LMP  (LMP Unknown)   SpO2 97%   BMI 26.22 kg/m    Physical Exam Vitals and nursing note reviewed.  Constitutional:      General: She is not  in acute distress.    Appearance: Normal appearance.  Cardiovascular:     Rate and Rhythm: Normal rate.  Pulmonary:     Effort: Pulmonary effort is normal.  Skin:    General: Skin is warm and dry.  Neurological:     General: No focal deficit present.     Mental Status: She is alert. Mental status is at baseline.  Psychiatric:        Mood and Affect: Mood normal.        Behavior: Behavior normal.        Thought Content: Thought content normal.        Judgment: Judgment normal.      No results found for any visits on 04/20/24.  Last lipids Lab Results  Component Value Date   CHOL 784 (H) 02/15/2024   HDL NOT REPORTED DUE TO HIGH TRIGLYCERIDES 02/15/2024   LDLCALC UNABLE TO CALCULATE IF TRIGLYCERIDE OVER 400 mg/dL 94/93/7974   LDLDIRECT 76 02/15/2024   TRIG >5,000 (H) 02/15/2024   CHOLHDL NOT REPORTED DUE TO HIGH TRIGLYCERIDES 02/15/2024   Last hemoglobin A1c Lab Results  Component Value Date   HGBA1C 10.6 (H) 02/14/2024      The ASCVD Risk score (Arnett DK, et al., 2019) failed to calculate for the following reasons:   Risk score cannot be calculated because patient has a medical history suggesting prior/existing ASCVD  Assessment & Plan:   Problem List Items Addressed This Visit     Hypertriglyceridemia - Primary   Taking atorvastatin  80 mg, fenofibrate  160 mg and omega -3 2000 mg BID  Denies chest pain, shortness of breath, lower extremity edema, myalgias, muscle weakness, changes in appearance of urine.  Last lipid panel in 5/6: cholesterol 784, triglycerides > 5000. She has been referred to the lipid clinic with [facility]. She has not had this appointment yet. Encouraged her to follow-up and to continue to watch her diet.        Relevant Medications   atorvastatin  (LIPITOR ) 80 MG tablet   omega-3 acid ethyl esters (LOVAZA ) 1 g capsule   fenofibrate  160 MG tablet   Hyponatremia   Relevant Medications   omega-3 acid ethyl esters (LOVAZA ) 1 g capsule    Other Relevant Orders   CBC   Comprehensive metabolic panel with GFR   Uncontrolled type 2 diabetes mellitus with hyperglycemia (HCC)   Did not start taking Lantus  15 units that was prescribed on 5/13. Reports pharmacy did not have it. Did not make provider aware of this. Taking glipizide  5 mg and metformin  850 mg BID. Last A1C on 5/5 increased to 10.6 Long discussion around getting diabetes under control for longevity. Unable to make diabetes education appointment, will reschedule. Lantus  sent to different pharmacy today. All refills sent.  Follow-up in 3 months for A1C. Monitor blood sugars at home with goal of fasting between 80-130. Encouraged lifestyle changes to include diet and exercise.        Relevant Medications   atorvastatin  (LIPITOR ) 80 MG tablet   Insulin  Glargine Solostar (LANTUS ) 100 UNIT/ML Solostar Pen   glipiZIDE  (GLUCOTROL ) 5 MG tablet   metFORMIN  (GLUCOPHAGE ) 850 MG tablet   Other Relevant Orders   CBC   Comprehensive metabolic panel with GFR   Hypercholesterolemia   Relevant Medications   atorvastatin  (LIPITOR ) 80 MG tablet   omega-3 acid ethyl esters (LOVAZA ) 1 g capsule   fenofibrate  160 MG tablet   Anxiety and depression   Taking Lexapro  10 mg most days. Reports skipping days. Instructed her this medication should  be taken daily for best results. PHQ-9 score today: 16 with thoughts of self harm. GAD-7 score: 19 today Contracts for safety as she has a son to live for. Referral to psychiatry placed today. Refill sent.       Relevant Medications   escitalopram  (LEXAPRO ) 10 MG tablet   Other Relevant Orders   Ambulatory referral to Psychiatry  Agrees with plan of care discussed.  Questions answered.   Return in about 3 months (around 07/21/2024) for DM, HLD, depression .    Darice JONELLE Brownie, FNP

## 2024-04-20 NOTE — Assessment & Plan Note (Deleted)
 Did not start taking Lantus  15 units that was prescribed on 5/13. Reports pharmacy did not have it. Did not make provider aware of this. Taking glipizide  5 mg and metformin  850 mg BID. Last A1C on 5/5 increased to 10.6 Long discussion around getting diabetes under control for longevity. Unable to make diabetes education appointment, will reschedule. Lantus  sent to different pharmacy today. All refills sent.  Follow-up in 3 months for A1C. Monitor blood sugars at home with goal of fasting between 80-130. Encouraged lifestyle changes to include diet and exercise.

## 2024-04-20 NOTE — Assessment & Plan Note (Signed)
 Did not start taking Lantus  15 units that was prescribed on 5/13. Reports pharmacy did not have it. Did not make provider aware of this. Taking glipizide  5 mg and metformin  850 mg BID. Last A1C on 5/5 increased to 10.6 Long discussion around getting diabetes under control for longevity. Unable to make diabetes education appointment, will reschedule. Lantus  sent to different pharmacy today. All refills sent.  Follow-up in 3 months for A1C. Monitor blood sugars at home with goal of fasting between 80-130. Encouraged lifestyle changes to include diet and exercise.

## 2024-04-20 NOTE — Patient Instructions (Signed)
 I spoke with Robyn Rivera at the Ophthalmologist (Dr. Radiochenko) office today at [address] [place], [place] [address]. They have scheduled an appointment for 07/06/2024 at 8:30 am. You can reach their office at [phone]. If you have any questions.   Reschedule the appointment with diabetes education.

## 2024-04-20 NOTE — Assessment & Plan Note (Signed)
 Taking Lexapro  10 mg most days. Reports skipping days. Instructed her this medication should  be taken daily for best results. PHQ-9 score today: 16 with thoughts of self harm. GAD-7 score: 19 today Contracts for safety as she has a son to live for. Referral to psychiatry placed today. Refill sent.

## 2024-04-20 NOTE — Assessment & Plan Note (Signed)
 Taking atorvastatin  80 mg, fenofibrate  160 mg and omega -3 2000 mg BID  Denies chest pain, shortness of breath, lower extremity edema, myalgias, muscle weakness, changes in appearance of urine.  Last lipid panel in 5/6: cholesterol 784, triglycerides > 5000. She has been referred to the lipid clinic with [facility]. She has not had this appointment yet. Encouraged her to follow-up and to continue to watch her diet.

## 2024-04-26 ENCOUNTER — Ambulatory Visit: Payer: Self-pay | Admitting: Family Medicine

## 2024-04-26 LAB — COMPREHENSIVE METABOLIC PANEL WITH GFR
ALT: 64 IU/L — ABNORMAL HIGH (ref 0–32)
AST: 32 IU/L (ref 0–40)
Alkaline Phosphatase: 137 IU/L — ABNORMAL HIGH (ref 44–121)
BUN/Creatinine Ratio: 40 — ABNORMAL HIGH (ref 9–23)
BUN: 24 mg/dL (ref 6–24)
Bilirubin Total: 0.4 mg/dL (ref 0.0–1.2)
CO2: 18 mmol/L — ABNORMAL LOW (ref 20–29)
Calcium: 9.3 mg/dL (ref 8.7–10.2)
Chloride: 89 mmol/L — ABNORMAL LOW (ref 96–106)
Creatinine, Ser: 0.6 mg/dL (ref 0.57–1.00)
Glucose: 187 mg/dL — ABNORMAL HIGH (ref 70–99)
Sodium: 120 mmol/L — ABNORMAL LOW (ref 134–144)
Total Protein: 5.7 g/dL — ABNORMAL LOW (ref 6.0–8.5)
eGFR: 113 mL/min/1.73 (ref >59)

## 2024-04-26 LAB — CBC
Hematocrit: 37.7 % (ref 34.0–46.6)
Hemoglobin: 12.3 g/dL (ref 11.1–15.9)
MCH: 29.5 pg (ref 26.6–33.0)
MCHC: 32.6 g/dL (ref 31.5–35.7)
MCV: 90 fL (ref 79–97)
Platelets: 192 x10E3/uL (ref 150–450)
RBC: 4.17 x10E6/uL (ref 3.77–5.28)
RDW: 14.4 % (ref 11.7–15.4)
WBC: 5.5 x10E3/uL (ref 3.4–10.8)

## 2024-05-02 ENCOUNTER — Ambulatory Visit (HOSPITAL_COMMUNITY): Payer: Self-pay | Admitting: Registered Nurse

## 2024-05-05 ENCOUNTER — Encounter: Payer: Self-pay | Admitting: *Deleted

## 2024-05-05 DIAGNOSIS — Z006 Encounter for examination for normal comparison and control in clinical research program: Secondary | ICD-10-CM

## 2024-05-05 NOTE — Research (Addendum)
 Message sent to Dr Mona to inform him of Robyn. Rivera possibly meeting criteria  for [facility]. He states that its ok to contact her.  Message left for Robyn Pfluger about [facility] research study. Encouraged her to call if she would like more information.

## 2024-05-16 ENCOUNTER — Encounter: Payer: Self-pay | Admitting: *Deleted

## 2024-05-16 DIAGNOSIS — Z006 Encounter for examination for normal comparison and control in clinical research program: Secondary | ICD-10-CM

## 2024-05-16 NOTE — Research (Signed)
 Message left for Robyn Rivera about Robyn Rivera research. Encouraged her to call with any questions, or if she would like more information.

## 2024-06-20 ENCOUNTER — Emergency Department (HOSPITAL_BASED_OUTPATIENT_CLINIC_OR_DEPARTMENT_OTHER)
Admission: EM | Admit: 2024-06-20 | Discharge: 2024-06-20 | Disposition: A | Discharge status: Home / Self Care | Attending: Emergency Medicine | Admitting: Emergency Medicine

## 2024-06-20 ENCOUNTER — Other Ambulatory Visit: Payer: Self-pay

## 2024-06-20 ENCOUNTER — Other Ambulatory Visit (HOSPITAL_BASED_OUTPATIENT_CLINIC_OR_DEPARTMENT_OTHER): Payer: Self-pay

## 2024-06-20 ENCOUNTER — Encounter (HOSPITAL_BASED_OUTPATIENT_CLINIC_OR_DEPARTMENT_OTHER): Payer: Self-pay | Admitting: Emergency Medicine

## 2024-06-20 DIAGNOSIS — Z7984 Long term (current) use of oral hypoglycemic drugs: Secondary | ICD-10-CM | POA: Insufficient documentation

## 2024-06-20 DIAGNOSIS — Z794 Long term (current) use of insulin: Secondary | ICD-10-CM | POA: Insufficient documentation

## 2024-06-20 DIAGNOSIS — G510 Bell's palsy: Secondary | ICD-10-CM | POA: Insufficient documentation

## 2024-06-20 DIAGNOSIS — E119 Type 2 diabetes mellitus without complications: Secondary | ICD-10-CM | POA: Insufficient documentation

## 2024-06-20 DIAGNOSIS — R29810 Facial weakness: Secondary | ICD-10-CM | POA: Diagnosis present

## 2024-06-20 DIAGNOSIS — E1165 Type 2 diabetes mellitus with hyperglycemia: Secondary | ICD-10-CM

## 2024-06-20 LAB — CBC WITH DIFFERENTIAL/PLATELET
Abs Immature Granulocytes: 0.02 K/uL (ref 0.00–0.07)
Basophils Absolute: 0 K/uL (ref 0.0–0.1)
Basophils Relative: 1 %
Eosinophils Absolute: 0.1 K/uL (ref 0.0–0.5)
Eosinophils Relative: 2 %
HCT: 36 % (ref 36.0–46.0)
Hemoglobin: 13.1 g/dL (ref 12.0–15.0)
Immature Granulocytes: 0 %
Lymphocytes Relative: 27 %
Lymphs Abs: 1.3 K/uL (ref 0.7–4.0)
MCH: 30.8 pg (ref 26.0–34.0)
MCHC: 36.4 g/dL — ABNORMAL HIGH (ref 30.0–36.0)
MCV: 84.5 fL (ref 80.0–100.0)
Monocytes Absolute: 0.3 K/uL (ref 0.1–1.0)
Monocytes Relative: 6 %
Neutro Abs: 3 K/uL (ref 1.7–7.7)
Neutrophils Relative %: 64 %
Platelets: 180 K/uL (ref 150–400)
RBC: 4.26 MIL/uL (ref 3.87–5.11)
RDW: 14.2 % (ref 11.5–15.5)
WBC: 4.7 K/uL (ref 4.0–10.5)
nRBC: 0 % (ref 0.0–0.2)

## 2024-06-20 LAB — URINALYSIS, ROUTINE W REFLEX MICROSCOPIC
Glucose, UA: >=500 mg/dL — AB
Hgb urine dipstick: NEGATIVE
Ketones, ur: NEGATIVE mg/dL
Leukocytes,Ua: NEGATIVE
Nitrite: POSITIVE — AB
Protein, ur: 30 mg/dL — AB
Specific Gravity, Urine: >=1.03 (ref 1.005–1.030)
pH: 5.5 (ref 5.0–8.0)

## 2024-06-20 LAB — CBG MONITORING, ED: Glucose-Capillary: 300 mg/dL — ABNORMAL HIGH (ref 70–99)

## 2024-06-20 LAB — COMPREHENSIVE METABOLIC PANEL WITH GFR
ALT: 132 U/L — ABNORMAL HIGH (ref 0–44)
AST: 127 U/L — ABNORMAL HIGH (ref 15–41)
Albumin: 4.6 g/dL (ref 3.5–5.0)
Alkaline Phosphatase: 116 U/L (ref 38–126)
BUN: 16 mg/dL (ref 6–20)
CO2: <7 mmol/L — ABNORMAL LOW (ref 22–32)
Calcium: 9 mg/dL (ref 8.9–10.3)
Chloride: 97 mmol/L — ABNORMAL LOW (ref 98–111)
Creatinine, Ser: 0.82 mg/dL (ref 0.44–1.00)
GFR, Estimated: >60 mL/min (ref >60)
Glucose, Bld: 308 mg/dL — ABNORMAL HIGH (ref 70–99)
Potassium: 3.7 mmol/L (ref 3.5–5.1)
Sodium: 130 mmol/L — ABNORMAL LOW (ref 135–145)
Total Bilirubin: 1 mg/dL (ref 0.0–1.2)
Total Protein: 7.2 g/dL (ref 6.5–8.1)

## 2024-06-20 LAB — URINALYSIS, MICROSCOPIC (REFLEX)

## 2024-06-20 LAB — PREGNANCY, URINE: Preg Test, Ur: NEGATIVE

## 2024-06-20 LAB — TROPONIN T, HIGH SENSITIVITY: Troponin T High Sensitivity: <15 ng/L (ref 0–19)

## 2024-06-20 MED ORDER — INSULIN GLARGINE SOLOSTAR 100 UNIT/ML ~~LOC~~ SOPN
15.0000 [IU] | PEN_INJECTOR | Freq: Every day | SUBCUTANEOUS | 1 refills | Status: AC
  Filled 2024-06-20: qty 12, 80d supply, fill #0
  Filled 2024-08-06 – 2024-09-21 (×2): qty 12, 80d supply, fill #1

## 2024-06-20 MED ORDER — VALACYCLOVIR HCL 1 G PO TABS
1000.0000 mg | ORAL_TABLET | Freq: Three times a day (TID) | ORAL | 0 refills | Status: AC
  Filled 2024-06-20: qty 21, 7d supply, fill #0

## 2024-06-20 MED ORDER — PREDNISONE 20 MG PO TABS
60.0000 mg | ORAL_TABLET | Freq: Every day | ORAL | 0 refills | Status: AC
  Filled 2024-06-20: qty 15, 5d supply, fill #0

## 2024-06-20 NOTE — Discharge Instructions (Addendum)
 1.  Start the prednisone  and Valtrex  today. 2.  Monitor your blood sugars closely. 3.  Get a eye lubricant and paper tape to protect your eye at night.  Sometimes the eye will stay open as you sleep and can dry out the cornea.  Be sure to place Lacri-Lube or other over-the-counter eye lubricant and use paper tape to close the eye if necessary. 4.  You should have a follow-up recheck.  If you get any pain in the eye, redness it will require immediate recheck. 5.  A resource guide is  included in your  discharge instructions with the contact information for [facility] teaching program and [facility] teaching program.  These are resident teaching program supervised by their instructing physicians.  They may be helpful in further evaluating and making referrals for difficult cases of diabetes and hyperlipidemia.  You should also call the endocrinologist office to get a specialty appointment with an endocrinologist.

## 2024-06-20 NOTE — ED Notes (Signed)
 Pt alert and oriented X 4 at the time of discharge. RR even and unlabored. No acute distress noted. Pt verbalized understanding of discharge instructions as discussed. Pt ambulatory to lobby at time of discharge.

## 2024-06-20 NOTE — ED Triage Notes (Addendum)
 Went to sleep with h/a and woke up with facial droop  left side cannot close eye  no slurred speech  drove herself to ER  no arm drift pt able to abulate dr armenta  into see pt,  c/o left jaw pain with blurred vision left eye

## 2024-06-20 NOTE — ED Provider Notes (Signed)
 [facility] Provider Note   CSN: [id number] Arrival date & time: 06/20/24  1059     Patient presents with: Facial Droop   Robyn Rivera is a 48 y.o. female.   HPI Patient reports that she awakened this morning and noticed that her left side of the face was droopy and he was having trouble closing the left eye.  Patient reports that yesterday she had some left-sided headache and some discomfort behind her ear but no other specific symptoms.  No fevers, no chills, no rash.  No other weakness numbness or tingling of the extremities.  No visual disturbance.  Patient denies ever having had similar episode.    Prior to Admission medications   Medication Sig Start Date End Date Taking? Authorizing Provider  predniSONE  (DELTASONE ) 20 MG tablet Take 3 tablets (60 mg total) by mouth daily. 3 tabs po day one, then 2 tabs daily x 4 days 06/20/24  Yes Jacey Eckerson, Ludivina, MD  valACYclovir  (VALTREX ) 1000 MG tablet Take 1 tablet (1,000 mg total) by mouth 3 (three) times daily. 06/20/24  Yes Armenta Ludivina, MD  acetaminophen  (TYLENOL ) 325 MG tablet Take 2 tablets (650 mg total) by mouth every 6 (six) hours as needed for mild pain (pain score 1-3) (or Fever >/= 101). Patient not taking: Reported on 04/20/2024 09/09/23   Will Almarie MATSU, MD  ARTIFICIAL TEARS PF 0.1-0.3 % SOLN Place 1 drop into both eyes 3 (three) times daily as needed (for dryness). Patient not taking: Reported on 04/20/2024    [provider]  atorvastatin  (LIPITOR ) 80 MG tablet Take 1 tablet (80 mg total) by mouth daily. Patient not taking: Reported on 04/20/2024 04/20/24   Booker Darice SAUNDERS, FNP  Blood Glucose Monitoring Suppl DEVI 1 each by Does not apply route in the morning, at noon, and at bedtime. May substitute to any manufacturer covered by patient's insurance. 01/14/24   Booker Darice SAUNDERS, FNP  escitalopram  (LEXAPRO ) 10 MG tablet Take 1 tablet (10 mg total) by mouth daily. 04/20/24    Booker Darice SAUNDERS, FNP  fenofibrate  160 MG tablet Take 1 tablet (160 mg total) by mouth daily. 04/20/24   Booker Darice SAUNDERS, FNP  glipiZIDE  (GLUCOTROL ) 5 MG tablet Take 1 tablet (5 mg total) by mouth daily before breakfast. 04/20/24   Booker Darice SAUNDERS, FNP  Glucose Blood (BLOOD GLUCOSE TEST STRIPS) STRP 1 each by In Vitro route in the morning, at noon, and at bedtime. May substitute to any manufacturer covered by patient's insurance. 01/14/24   Booker Darice SAUNDERS, FNP  Insulin  Glargine Solostar (LANTUS ) 100 UNIT/ML Solostar Pen Inject 15 Units into the skin at bedtime. 06/20/24   Armenta Ludivina, MD  magnesium  oxide (MAG-OX) 400 (240 Mg) MG tablet Take 1 tablet (400 mg total) by mouth daily. Patient not taking: Reported on 04/20/2024 02/15/24   Elgergawy, Brayton RAMAN, MD  metFORMIN  (GLUCOPHAGE ) 850 MG tablet Take 1 tablet (850 mg total) by mouth 2 (two) times daily with a meal. 04/20/24   Booker Darice SAUNDERS, FNP  omega-3 acid ethyl esters (LOVAZA ) 1 g capsule Take 2 capsules (2 g total) by mouth 2 (two) times daily. 04/20/24   Booker Darice SAUNDERS, FNP    Allergies: Icy hot, Niacin, and Penicillins    Review of Systems  Updated Vital Signs BP (!) 156/87 (BP Location: Right Arm)   Pulse 67   Temp 99 F (37.2 C)   Resp 18   Ht 5' 3 (1.6 m)  Wt 66.7 kg   SpO2 91%   BMI 26.04 kg/m   Physical Exam Constitutional:      Comments: Alert nontoxic well in appearance.  No respiratory distress  HENT:     Head:     Comments: Patient has distinct facial droop on the left side.  There is decreased movement of the brow and the left eye is not blinking in conjunction with the right.    Right Ear: Tympanic membrane normal.     Left Ear: Tympanic membrane normal.     Ears:     Comments: TM normal.  Pinna normal.  There is no swelling or rash in the ear canal or around the pinna.    Nose: Nose normal.     Mouth/Throat:     Mouth: Mucous membranes are moist.     Pharynx: Oropharynx is clear.  Eyes:     Extraocular Movements:  Extraocular movements intact.     Conjunctiva/sclera: Conjunctivae normal.     Pupils: Pupils are equal, round, and reactive to light.  Cardiovascular:     Rate and Rhythm: Normal rate and regular rhythm.  Pulmonary:     Effort: Pulmonary effort is normal.     Breath sounds: Normal breath sounds.  Abdominal:     General: There is no distension.     Palpations: Abdomen is soft.     Tenderness: There is no abdominal tenderness. There is no guarding.  Musculoskeletal:        General: No swelling or tenderness. Normal range of motion.     Cervical back: Neck supple.     Right lower leg: No edema.     Left lower leg: No edema.  Skin:    General: Skin is warm and dry.  Neurological:     Comments: Patient is alert with clear mental status.  Speech is normal with normal content.  Patient has facial droop on the left side which includes decreased movement of the brow and decreased blinking of the left eye.  Extraocular motions are normal.  Motor strength 5\5 upper and lower extremities.  Gait steady.  Sensation intact.  Psychiatric:        Mood and Affect: Mood normal.     (all labs ordered are listed, but only abnormal results are displayed) Labs Reviewed  CBC WITH DIFFERENTIAL/PLATELET - Abnormal; Notable for the following components:      Result Value   MCHC 36.4 (*)    All other components within normal limits  COMPREHENSIVE METABOLIC PANEL WITH GFR - Abnormal; Notable for the following components:   Sodium 130 (*)    Chloride 97 (*)    CO2 <7 (*)    Glucose, Bld 308 (*)    AST 127 (*)    ALT 132 (*)    All other components within normal limits  URINALYSIS, ROUTINE W REFLEX MICROSCOPIC - Abnormal; Notable for the following components:   APPearance HAZY (*)    Glucose, UA >=500 (*)    Bilirubin Urine SMALL (*)    Protein, ur 30 (*)    Nitrite POSITIVE (*)    All other components within normal limits  URINALYSIS, MICROSCOPIC (REFLEX) - Abnormal; Notable for the following  components:   Bacteria, UA MANY (*)    All other components within normal limits  CBG MONITORING, ED - Abnormal; Notable for the following components:   Glucose-Capillary 300 (*)    All other components within normal limits  PREGNANCY, URINE  TROPONIN T, HIGH SENSITIVITY  TROPONIN T, HIGH SENSITIVITY    EKG: EKG Interpretation Date/Time:  Tuesday June 20 2024 11:13:21 EDT Ventricular Rate:  74 PR Interval:  158 QRS Duration:  88 QT Interval:  429 QTC Calculation: 476 R Axis:   57  Text Interpretation: Sinus rhythm no sig chnage from previous Confirmed by Armenta Canning [phone]) on 06/20/2024 11:49:29 AM  Radiology: No results found.   Procedures   Medications Ordered in the ED - No data to display                                  Medical Decision Making Amount and/or Complexity of Data Reviewed Labs: ordered.   Patient presents as outlined.  Acute onset of left facial paralysis.  On examination findings are consistent with Bell's palsy which includes forehead and left eye aperture with decreased closure.  Remainder of neurologic examination is normal.  Pregnancy test negative metabolic panel sodium 130 blood glucose 308 GFR greater than 60 troponin less than 15 urinalysis positive nitrite 0-5 WBC 0-5 RBC  Review of EMR indicates patient has had MRI brain and CTA head and neck 5\25 for presentation of his headache and blurred vision.  There were no acute findings on those imagings.  At this time with clinical exam.  Consistent with Bell's palsy I do not think that additional imaging is indicated.  Patient does have significant history of diabetes and triglyceridemia but has been difficult to control.  She does not show any signs of being in DKA.  She does have hyponatremia that is probably pseudohyponatremia and better than prior values.  We did discuss the patient's current diabetes management and follow-up plan.  Patient reports she is compliant with her oral  medications but her pharmacy has not had the Lantus  that her prescriber ordered.  At this time I we will order the Lantus  from our pharmacy by the same prescription and for the Bell's palsy treat with Valtrex  and prednisone .  Patient is aware that steroids can increase her blood sugar she will continue to monitor this closely at home and try to start the Lantus  as previously ordered.     Final diagnoses:  Bell's palsy    ED Discharge Orders          Ordered    valACYclovir  (VALTREX ) 1000 MG tablet  3 times daily        06/20/24 1316    predniSONE  (DELTASONE ) 20 MG tablet  Daily        06/20/24 1316    Insulin  Glargine Solostar (LANTUS ) 100 UNIT/ML Solostar Pen  Daily at bedtime        06/20/24 1316               Armenta Canning, MD 06/20/24 1339

## 2024-07-25 ENCOUNTER — Other Ambulatory Visit: Payer: Self-pay | Admitting: Family Medicine

## 2024-07-25 DIAGNOSIS — E1165 Type 2 diabetes mellitus with hyperglycemia: Secondary | ICD-10-CM

## 2024-07-27 ENCOUNTER — Ambulatory Visit: Admitting: Family Medicine

## 2024-08-07 ENCOUNTER — Other Ambulatory Visit (HOSPITAL_BASED_OUTPATIENT_CLINIC_OR_DEPARTMENT_OTHER): Payer: Self-pay

## 2024-09-21 ENCOUNTER — Other Ambulatory Visit (HOSPITAL_BASED_OUTPATIENT_CLINIC_OR_DEPARTMENT_OTHER): Payer: Self-pay

## 2024-09-26 ENCOUNTER — Other Ambulatory Visit (HOSPITAL_BASED_OUTPATIENT_CLINIC_OR_DEPARTMENT_OTHER): Payer: Self-pay

## 2024-10-23 ENCOUNTER — Other Ambulatory Visit: Payer: Self-pay | Admitting: Family Medicine

## 2024-10-23 DIAGNOSIS — E871 Hypo-osmolality and hyponatremia: Secondary | ICD-10-CM

## 2024-10-23 DIAGNOSIS — E781 Pure hyperglyceridemia: Secondary | ICD-10-CM

## 2024-11-07 ENCOUNTER — Other Ambulatory Visit: Payer: Self-pay | Admitting: Family Medicine

## 2024-11-07 DIAGNOSIS — E781 Pure hyperglyceridemia: Secondary | ICD-10-CM

## 2024-11-07 DIAGNOSIS — E871 Hypo-osmolality and hyponatremia: Secondary | ICD-10-CM
